# Patient Record
Sex: Female | Born: 1990 | Race: White | Hispanic: No | Marital: Single | Smoking: Current some day smoker
Health system: Southern US, Community
[De-identification: ages and names within clinical notes are randomized; demographics above are authoritative.]

## PROBLEM LIST (undated history)

## (undated) DIAGNOSIS — F4310 Post-traumatic stress disorder, unspecified: Secondary | ICD-10-CM

## (undated) DIAGNOSIS — A749 Chlamydial infection, unspecified: Secondary | ICD-10-CM

## (undated) DIAGNOSIS — I10 Essential (primary) hypertension: Secondary | ICD-10-CM

## (undated) DIAGNOSIS — K219 Gastro-esophageal reflux disease without esophagitis: Secondary | ICD-10-CM

## (undated) DIAGNOSIS — N83209 Unspecified ovarian cyst, unspecified side: Secondary | ICD-10-CM

## (undated) DIAGNOSIS — E785 Hyperlipidemia, unspecified: Secondary | ICD-10-CM

## (undated) HISTORY — DX: Essential (primary) hypertension: I10

## (undated) HISTORY — DX: Post-traumatic stress disorder, unspecified: F43.10

## (undated) HISTORY — PX: OTHER SURGICAL HISTORY: SHX169

## (undated) HISTORY — DX: Hyperlipidemia, unspecified: E78.5

## (undated) HISTORY — DX: Gastro-esophageal reflux disease without esophagitis: K21.9

---
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
## 2008-06-29 ENCOUNTER — Emergency Department (HOSPITAL_COMMUNITY): Admission: EM | Admit: 2008-06-29 | Discharge: 2008-06-30 | Payer: Self-pay | Admitting: Emergency Medicine

## 2008-09-12 ENCOUNTER — Emergency Department (HOSPITAL_COMMUNITY): Admission: EM | Admit: 2008-09-12 | Discharge: 2008-09-13 | Payer: Self-pay | Admitting: Emergency Medicine

## 2010-06-27 ENCOUNTER — Emergency Department (HOSPITAL_COMMUNITY)
Admission: EM | Admit: 2010-06-27 | Discharge: 2010-06-27 | Disposition: A | Discharge status: Home / Self Care | Payer: Self-pay | Attending: Emergency Medicine | Admitting: Emergency Medicine

## 2010-06-27 DIAGNOSIS — H9209 Otalgia, unspecified ear: Secondary | ICD-10-CM | POA: Insufficient documentation

## 2010-08-10 LAB — BASIC METABOLIC PANEL
CO2: 18 mEq/L — ABNORMAL LOW (ref 19–32)
Chloride: 102 mEq/L (ref 96–112)
Creatinine, Ser: 0.78 mg/dL (ref 0.4–1.2)
GFR calc Af Amer: >60 mL/min (ref >60)
Glucose, Bld: 128 mg/dL — ABNORMAL HIGH (ref 70–99)
Potassium: 3.1 mEq/L — ABNORMAL LOW (ref 3.5–5.1)

## 2010-08-10 LAB — DIFFERENTIAL
Basophils Relative: 0 % (ref 0–1)
Lymphocytes Relative: 13 % (ref 12–46)
Monocytes Absolute: 0.8 10*3/uL (ref 0.1–1.0)
Neutrophils Relative %: 83 % — ABNORMAL HIGH (ref 43–77)

## 2010-08-10 LAB — CBC
HCT: 37.8 % (ref 36.0–46.0)
MCHC: 36 g/dL (ref 30.0–36.0)
Platelets: 362 10*3/uL (ref 150–400)
RBC: 4.33 MIL/uL (ref 3.87–5.11)

## 2010-09-17 NOTE — H&P (Signed)
NAME:  Gina Fletcher, Gina Fletcher                        ACCOUNT NO.:  [id number]   MEDICAL RECORD NO.:  [id number]                   PATIENT TYPE:  INP   LOCATION:  A426                                 FACILITY:  [facility]   PHYSICIAN:  Scott A. Gerda Diss, M.D.               DATE OF BIRTH:  07-24-1990   DATE OF ADMISSION:  01/16/2004  DATE OF DISCHARGE:                                HISTORY & PHYSICAL   CHIEF COMPLAINT:  Abdominal pain, fever.   HISTORY OF PRESENT ILLNESS:  This is a 20 year old white female who states  she had onset of abdominal discomfort in the lower abdomen, and in her low  back that started about a week ago, progressed then to having sharp pains in  the lower and upper abdomen, along with increasing discomfort, but nothing  severe, no nausea or vomiting, initially no fever, no cough.  Denies any  dysuria or urinary frequency, just recently got off her cycle.  She was  asked in private if she is sexually active or having intercourse in any such  way, and she states emphatically no, and she never has.  The patient has run  a fever in the evening time for the past couple of nights.  No vomiting.   SOCIAL HISTORY:  Lives with mom.  She does not drink.   FAMILY HISTORY:  Noncontributory.   PAST MEDICAL HISTORY:  No prior admissions except when 33 months of age for  routine illness.   IMMUNIZATIONS:  Up to date.   ALLERGIES:  Not allergic to any medicines.  No on any medications.   REVIEW OF SYSTEMS:  Per above.   PHYSICAL EXAMINATION:  GENERAL:  __________ .  HEENT:  TMs, __________.  MMs moist.  NECK:  Supple.  CHEST:  CTA, no crackles.  HEART:  Regular, no murmurs.  ABDOMEN:  Soft with some subjective lower-abdominal discomfort, but no  guarding or rebound.  FLANK:  Nontender although on the right flank when percussion is done, she  relates some mild discomfort in her abdomen.   Her white count is elevated with a left shift.   Urinalysis has TNTC WBCs with many  bacteria.   CT scan showed a enlargement of the ovaries.  No clear visualization of the  appendix was seen.  No other particular findings.   ASSESSMENT/PLAN:  Febrile illness with probable urinary tract infection.   We will go ahead and do an abdominal ultrasound with pelvic ultrasound to  look at the ovaries and also make sure there is no abscess with kidneys.  I  feel the patient overall should respond to Rocephin.  I truly doubt there is  pelvic inflammatory disease going on.  The patient denies sexual activity,  and I really doubt there is appendicitis going on.  We will repeat a CBC in  the morning.     ___________________________________________  Scott A. Gerda Diss, M.D.   SAL/MEDQ  D:  01/16/2004  T:  01/16/2004  Job:  [id number]

## 2010-09-17 NOTE — Discharge Summary (Signed)
NAMESHARRA, Gina Fletcher              ACCOUNT NO.:  [id number]   MEDICAL RECORD NO.:  [id number]          PATIENT TYPE:  INP   LOCATION:  A426                          FACILITY:  [facility]   PHYSICIAN:  Scott A. Gerda Diss, MD    DATE OF BIRTH:  1990-07-21   DATE OF ADMISSION:  01/16/2004  DATE OF DISCHARGE:  09/19/2005LH                                 DISCHARGE SUMMARY   DISCHARGE DIAGNOSES:  1.  Urinary tract infection.  2.  Hypovolemia.   HOSPITAL COURSE:  The patient was admitted in after experiencing abdominal  pain, fever, lower abdominal discomfort, flank discomfort, and not feeling  good.  Urinalysis had TNTC WBC's and many bacteria, and white count was  elevated with a left shift.  She was placed on IV antibiotics and gradually  improved over the course of the next couple of days.  The patient had  initial CT scan which could not tell the appendix, and because of her  ongoing abdominal pain and discomfort, we went ahead with additional CT scan  on January 19, 2004, which showed a normal appendix.  Therefore, the young  lady was discharged to home.  Her urine culture showed 30,000 of Group B  Strep.  She was discharged to home, given samples from the office, and  instructed to follow up with [place] or the health department within the coming  week.     Scot   SAL/MEDQ  D:  02/19/2004  T:  02/19/2004  Job:  [id number]

## 2010-11-06 ENCOUNTER — Encounter: Payer: Self-pay | Admitting: *Deleted

## 2010-11-06 ENCOUNTER — Emergency Department (HOSPITAL_COMMUNITY)
Admission: EM | Admit: 2010-11-06 | Discharge: 2010-11-06 | Disposition: A | Discharge status: Home / Self Care | Payer: Self-pay | Attending: Emergency Medicine | Admitting: Emergency Medicine

## 2010-11-06 DIAGNOSIS — Z23 Encounter for immunization: Secondary | ICD-10-CM | POA: Insufficient documentation

## 2010-11-06 DIAGNOSIS — F17200 Nicotine dependence, unspecified, uncomplicated: Secondary | ICD-10-CM | POA: Insufficient documentation

## 2010-11-06 DIAGNOSIS — S51812A Laceration without foreign body of left forearm, initial encounter: Secondary | ICD-10-CM

## 2010-11-06 DIAGNOSIS — W260XXA Contact with knife, initial encounter: Secondary | ICD-10-CM | POA: Insufficient documentation

## 2010-11-06 DIAGNOSIS — W261XXA Contact with sword or dagger, initial encounter: Secondary | ICD-10-CM | POA: Insufficient documentation

## 2010-11-06 DIAGNOSIS — S51809A Unspecified open wound of unspecified forearm, initial encounter: Secondary | ICD-10-CM | POA: Insufficient documentation

## 2010-11-06 MED ORDER — LIDOCAINE-EPINEPHRINE 2 %-1:100000 IJ SOLN
1.7000 mL | Freq: Once | INTRAMUSCULAR | Status: AC
  Administered 2010-11-06: 1.7 mL

## 2010-11-06 MED ORDER — LIDOCAINE HCL (PF) 1 % IJ SOLN
INTRAMUSCULAR | Status: AC
  Filled 2010-11-06: qty 5

## 2010-11-06 MED ORDER — TETANUS-DIPHTH-ACELL PERTUSSIS 5-2-15.5 LF-MCG/0.5 IM SUSP
0.5000 mL | Freq: Once | INTRAMUSCULAR | Status: AC
  Administered 2010-11-06: 0.5 mL via INTRAMUSCULAR

## 2010-11-06 MED ORDER — LIDOCAINE HCL 2 % IJ SOLN
INTRAMUSCULAR | Status: AC
  Administered 2010-11-06: 10 mL
  Filled 2010-11-06: qty 1

## 2010-11-06 MED ORDER — TETANUS-DIPHTH-ACELL PERTUSSIS 5-2-15.5 LF-MCG/0.5 IM SUSP
INTRAMUSCULAR | Status: AC
  Administered 2010-11-06: 0.5 mL via INTRAMUSCULAR
  Filled 2010-11-06: qty 0.5
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
## 2010-11-12 ENCOUNTER — Encounter (HOSPITAL_COMMUNITY): Payer: Self-pay | Admitting: *Deleted

## 2010-11-17 ENCOUNTER — Emergency Department (HOSPITAL_COMMUNITY)
Admission: EM | Admit: 2010-11-17 | Discharge: 2010-11-17 | Disposition: A | Discharge status: Home / Self Care | Payer: Self-pay | Attending: Emergency Medicine | Admitting: Emergency Medicine

## 2010-11-17 ENCOUNTER — Encounter (HOSPITAL_COMMUNITY): Payer: Self-pay | Admitting: *Deleted

## 2010-11-17 DIAGNOSIS — Z4802 Encounter for removal of sutures: Secondary | ICD-10-CM | POA: Insufficient documentation

## 2010-11-17 NOTE — ED Provider Notes (Signed)
History     Chief Complaint  Patient presents with  . Suture / Staple Removal   Patient is a 20 y.o. female presenting with suture removal. The history is provided by the patient. The history is limited by a language barrier.  Suture / Staple Removal  The sutures were placed 11 to 14 days ago. There has been no treatment since the wound repair. There has been no drainage from the wound. The redness has improved. There is no swelling present. The pain has improved. She has no difficulty moving the affected extremity or digit.    History reviewed. No pertinent past medical history.  Past Surgical History  Procedure Date  . None     no surgical history    Family History  Problem Relation Age of Onset  . Adopted: Yes  . Hypertension Mother     History  Substance Use Topics  . Smoking status: Current Everyday Smoker -- 0.5 packs/day for 5 years  . Smokeless tobacco: Not on file  . Alcohol Use: Yes     5 days of the week    OB History    Grav Para Term Preterm Abortions TAB SAB Ect Mult Living   2 1   1     1       Review of Systems  Constitutional: Negative for fever and chills.  Musculoskeletal: Negative.   Skin: Positive for wound. Negative for color change.  Neurological: Negative for dizziness, weakness and numbness.  Hematological: Does not bruise/bleed easily.    Physical Exam  BP 143/93  Pulse 107  Temp(Src) 99.2 F (37.3 C) (Oral)  Resp 20  Ht 5\' 2"  (1.575 m)  Wt 155 lb (70.308 kg)  BMI 28.35 kg/m2  SpO2 100%  LMP 10/06/2010  Physical Exam  Nursing note and vitals reviewed. Constitutional: She is oriented to person, place, and time. She appears well-developed and well-nourished. No distress.  HENT:  Head: Normocephalic and atraumatic.  Neck: Normal range of motion.  Cardiovascular: Normal rate and regular rhythm.   Pulmonary/Chest: Effort normal and breath sounds normal.  Musculoskeletal: She exhibits no edema and no tenderness.  Neurological:  She is alert and oriented to person, place, and time. She exhibits abnormal muscle tone. Coordination normal.  Skin: Skin is warm and dry.       Laceration to the left forearm with previous sutured repair.  Wound appears to be healing well.  No erythema, swelling or drainage.  Distal sensation intact.  No obvious weakness .  Radial pulse and cap refill are strong and brisk.  Psychiatric: She has a normal mood and affect.    ED Course  Procedures  MDM  Laceration to left forearm appears to be well healed.  Distal sensation intact.  Radial pulse is strong.  Mild ttp surrounding the wound, w/o erythema, drainage, swelling or excessive warmth.  Patient has full ROM of the forearm and fingers.  I have advised her to f/u with ortho for recheck.  2310  Sutures removed by the nursing staff w/o difficulty, suture line remains intact.     Medical screening examination/treatment/procedure(s) were performed by non-physician practitioner and as supervising physician I was immediately available for consultation/collaboration.    Tammy L. Triplett, PA 11/27/10 1735  Tammy L. [place], [place] 11/27/10 1736  Sunnie Nielsen, MD 11/30/10 [phone]

## 2010-11-17 NOTE — ED Notes (Signed)
Pt here for suture removal

## 2012-12-09 ENCOUNTER — Encounter (HOSPITAL_COMMUNITY): Payer: Self-pay | Admitting: Emergency Medicine

## 2012-12-09 ENCOUNTER — Emergency Department (HOSPITAL_COMMUNITY)
Admission: EM | Admit: 2012-12-09 | Discharge: 2012-12-09 | Disposition: A | Discharge status: Home / Self Care | Payer: Self-pay | Attending: Emergency Medicine | Admitting: Emergency Medicine

## 2012-12-09 DIAGNOSIS — Z8742 Personal history of other diseases of the female genital tract: Secondary | ICD-10-CM | POA: Insufficient documentation

## 2012-12-09 DIAGNOSIS — R05 Cough: Secondary | ICD-10-CM | POA: Insufficient documentation

## 2012-12-09 DIAGNOSIS — F17200 Nicotine dependence, unspecified, uncomplicated: Secondary | ICD-10-CM | POA: Insufficient documentation

## 2012-12-09 DIAGNOSIS — H6690 Otitis media, unspecified, unspecified ear: Secondary | ICD-10-CM | POA: Insufficient documentation

## 2012-12-09 DIAGNOSIS — R059 Cough, unspecified: Secondary | ICD-10-CM | POA: Insufficient documentation

## 2012-12-09 DIAGNOSIS — R55 Syncope and collapse: Secondary | ICD-10-CM | POA: Insufficient documentation

## 2012-12-09 DIAGNOSIS — H9209 Otalgia, unspecified ear: Secondary | ICD-10-CM | POA: Insufficient documentation

## 2012-12-09 DIAGNOSIS — Z3202 Encounter for pregnancy test, result negative: Secondary | ICD-10-CM | POA: Insufficient documentation

## 2012-12-09 DIAGNOSIS — J3489 Other specified disorders of nose and nasal sinuses: Secondary | ICD-10-CM | POA: Insufficient documentation

## 2012-12-09 HISTORY — DX: Unspecified ovarian cyst, unspecified side: N83.209

## 2012-12-09 LAB — CBC WITH DIFFERENTIAL/PLATELET
Basophils Absolute: 0 10*3/uL (ref 0.0–0.1)
Basophils Relative: 0 % (ref 0–1)
Eosinophils Relative: 1 % (ref 0–5)
Hemoglobin: 12.6 g/dL (ref 12.0–15.0)
MCV: 88.9 fL (ref 78.0–100.0)
Monocytes Absolute: 0.8 10*3/uL (ref 0.1–1.0)
Neutrophils Relative %: 79 % — ABNORMAL HIGH (ref 43–77)
RDW: 14.1 % (ref 11.5–15.5)
WBC: 12.5 10*3/uL — ABNORMAL HIGH (ref 4.0–10.5)

## 2012-12-09 LAB — BASIC METABOLIC PANEL
BUN: 9 mg/dL (ref 6–23)
Calcium: 9.6 mg/dL (ref 8.4–10.5)
Chloride: 98 mEq/L (ref 96–112)
GFR calc non Af Amer: >90 mL/min (ref >90)
Glucose, Bld: 108 mg/dL — ABNORMAL HIGH (ref 70–99)
Potassium: 3.7 mEq/L (ref 3.5–5.1)

## 2012-12-09 MED ORDER — OXYMETAZOLINE HCL 0.05 % NA SOLN
1.0000 | Freq: Once | NASAL | Status: AC
  Administered 2012-12-09: 1 via NASAL
  Filled 2012-12-09: qty 15

## 2012-12-09 MED ORDER — PREDNISONE 50 MG PO TABS
60.0000 mg | ORAL_TABLET | Freq: Once | ORAL | Status: AC
  Administered 2012-12-09: 60 mg via ORAL
  Filled 2012-12-09: qty 1

## 2012-12-09 MED ORDER — SODIUM CHLORIDE 0.9 % IV BOLUS (SEPSIS)
1000.0000 mL | Freq: Once | INTRAVENOUS | Status: AC
  Administered 2012-12-09: 1000 mL via INTRAVENOUS

## 2012-12-09 MED ORDER — IBUPROFEN 800 MG PO TABS
800.0000 mg | ORAL_TABLET | Freq: Once | ORAL | Status: AC
  Administered 2012-12-09: 800 mg via ORAL
  Filled 2012-12-09: qty 1

## 2012-12-09 MED ORDER — AMOXICILLIN 500 MG PO CAPS: 500.0000 mg | ORAL_CAPSULE | Freq: Three times a day (TID) | ORAL | Status: DC

## 2012-12-09 MED ORDER — ANTIPYRINE-BENZOCAINE 5.4-1.4 % OT SOLN
3.0000 [drp] | Freq: Once | OTIC | Status: AC
  Administered 2012-12-09: 3 [drp] via OTIC
  Filled 2012-12-09: qty 10

## 2012-12-09 NOTE — ED Notes (Signed)
nad noted prior to dc. Dc instructions reviewed and explained. 1 script given. Pt voiced understanding of dc instructions. Ambulated out without difficulty.

## 2012-12-09 NOTE — ED Notes (Signed)
States that she hit the front of her head when she passed out.

## 2012-12-09 NOTE — ED Notes (Signed)
States that she started having a sore throat and congestion 3 days ago.  States that she started having an earache yesterday.  States that today, she stood up and passed out.  States she has been running a fever at home.

## 2012-12-09 NOTE — ED Provider Notes (Signed)
CSN: [id number]     Arrival date & time 12/09/12  1539 History  This chart was scribed for Gina Quarry, MD by Bennett Scrape, ED Scribe. This patient was seen in room APA09/APA09 and the patient's care was started at 4:30 PM.   Chief Complaint  Patient presents with  . Loss of Consciousness  . Otalgia  . Nasal Congestion  . Cough    The history is provided by the patient. No language interpreter was used.    HPI Comments: Gina Fletcher is a 22 y.o. female who presents to the Emergency Department complaining of left otalgia that started yesterday. She reports one episode of syncope upon waking up and standing this morning. Episode was unwitnessed and pt is unsure of the duration. 3 days of productive cough and congestion, sore throat. She reports a h/o frequent ear infections. She denies possibility of pregnancy. She reports a recent hospitalization due to an abscess which are recurrent for her  Past Medical History  Diagnosis Date  . Ovarian cyst    Past Surgical History  Procedure Laterality Date  . None      no surgical history   Family History  Problem Relation Age of Onset  . Adopted: Yes  . Hypertension Mother    History  Substance Use Topics  . Smoking status: Current Every Day Smoker -- 0.50 packs/day for 5 years  . Smokeless tobacco: Not on file  . Alcohol Use: Yes     Comment: 5 days of the week   OB History   Grav Para Term Preterm Abortions TAB SAB Ect Mult Living   2 1   1     1      Review of Systems  Allergies  Review of patient's allergies indicates no known allergies.  Home Medications  No current outpatient prescriptions on file. BP 116/96  Pulse 104  Temp(Src) 97.7 F (36.5 C) (Oral)  Resp 20  Ht 5\' 2"  (1.575 m)  Wt 159 lb 2 oz (72.179 kg)  BMI 29.1 kg/m2  SpO2 100%  LMP 11/25/2012 Physical Exam  Nursing note and vitals reviewed. Constitutional: She is oriented to person, place, and time. She appears well-developed and  well-nourished. No distress.  HENT:  Head: Normocephalic and atraumatic.  Right Ear: External ear normal.  Left Ear: External ear normal.  Nose: Nose normal.  Mouth/Throat: Oropharynx is clear and moist.  Left TM is dull and injected   Eyes: Conjunctivae and EOM are normal. Pupils are equal, round, and reactive to light.  Neck: Normal range of motion. No tracheal deviation present.  Cardiovascular: Normal rate, regular rhythm, normal heart sounds and intact distal pulses.   Pulmonary/Chest: Effort normal and breath sounds normal. No respiratory distress.  Abdominal: Soft. There is no tenderness.  Musculoskeletal: Normal range of motion. She exhibits no edema.  Neurological: She is alert and oriented to person, place, and time. No cranial nerve deficit.  Skin: Skin is warm and dry.  Psychiatric: She has a normal mood and affect. Her behavior is normal. Judgment and thought content normal.    ED Course   Procedures (including critical care time)  Labs Reviewed  CBC WITH DIFFERENTIAL  PREGNANCY, URINE  BASIC METABOLIC PANEL   No results found. No diagnosis found. Results for orders placed during the hospital encounter of 12/09/12  CBC WITH DIFFERENTIAL      Result Value Range   WBC 12.5 (*) 4.0 - 10.5 K/uL   RBC 4.14  3.87 - 5.11  MIL/uL   Hemoglobin 12.6  12.0 - 15.0 g/dL   HCT 16.1  09.6 - 04.5 %   MCV 88.9  78.0 - 100.0 fL   MCH 30.4  26.0 - 34.0 pg   MCHC 34.2  30.0 - 36.0 g/dL   RDW 40.9  81.1 - 91.4 %   Platelets 329  150 - 400 K/uL   Neutrophils Relative % 79 (*) 43 - 77 %   Neutro Abs 9.9 (*) 1.7 - 7.7 K/uL   Lymphocytes Relative 14  12 - 46 %   Lymphs Abs 1.8  0.7 - 4.0 K/uL   Monocytes Relative 6  3 - 12 %   Monocytes Absolute 0.8  0.1 - 1.0 K/uL   Eosinophils Relative 1  0 - 5 %   Eosinophils Absolute 0.1  0.0 - 0.7 K/uL   Basophils Relative 0  0 - 1 %   Basophils Absolute 0.0  0.0 - 0.1 K/uL  BASIC METABOLIC PANEL      Result Value Range   Sodium 134 (*)  135 - 145 mEq/L   Potassium 3.7  3.5 - 5.1 mEq/L   Chloride 98  96 - 112 mEq/L   CO2 26  19 - 32 mEq/L   Glucose, Bld 108 (*) 70 - 99 mg/dL   BUN 9  6 - 23 mg/dL   Creatinine, Ser 7.82  0.50 - 1.10 mg/dL   Calcium 9.6  8.4 - 95.6 mg/dL   GFR calc non Af Amer >90  >90 mL/min   GFR calc Af Amer >90  >90 mL/min     MDM  21 y.o. Female with otalgia and uri symptoms with left om who presents with ear pain, uri, and syncope this a.m.  EKG normal and patient with probable vasovagal syncope this am with ur for several days and decreased po.  Plan amoxicillin, afrin, and auralgan and advised increase po intake.   I personally performed the services described in this documentation, which was scribed in my presence. The recorded information has been reviewed and considered.   Gina Quarry, MD 12/09/12 [phone]

## 2012-12-09 NOTE — ED Notes (Signed)
Pt c/o nasal congestion, left ear pain, intermittent dizziness, and nausea x3 days. Pt states earlier today she stood up and become dizzy and "blacked out". Pt states she hit her forehead on the ground when she fell. Pt reports ringing in left ear. Pt describes ear pain as sharp. Pt also reports cough that is productive with yellow sputum.

## 2014-03-03 ENCOUNTER — Encounter (HOSPITAL_COMMUNITY): Payer: Self-pay | Admitting: Emergency Medicine

## 2020-03-20 ENCOUNTER — Emergency Department (HOSPITAL_COMMUNITY)
Admission: EM | Admit: 2020-03-20 | Discharge: 2020-03-20 | Disposition: A | Discharge status: Home / Self Care | Payer: Self-pay | Attending: Emergency Medicine | Admitting: Emergency Medicine

## 2020-03-20 ENCOUNTER — Encounter (HOSPITAL_COMMUNITY): Payer: Self-pay

## 2020-03-20 ENCOUNTER — Other Ambulatory Visit: Payer: Self-pay

## 2020-03-20 DIAGNOSIS — K0889 Other specified disorders of teeth and supporting structures: Secondary | ICD-10-CM | POA: Insufficient documentation

## 2020-03-20 DIAGNOSIS — Z87891 Personal history of nicotine dependence: Secondary | ICD-10-CM | POA: Insufficient documentation

## 2020-03-20 MED ORDER — OXYCODONE-ACETAMINOPHEN 5-325 MG PO TABS
1.0000 | ORAL_TABLET | Freq: Once | ORAL | Status: AC
  Administered 2020-03-20: 1 via ORAL
  Filled 2020-03-20: qty 1

## 2020-03-20 MED ORDER — PENICILLIN V POTASSIUM 500 MG PO TABS: 500.0000 mg | ORAL_TABLET | Freq: Four times a day (QID) | ORAL | 0 refills | Status: AC

## 2020-03-20 NOTE — ED Triage Notes (Signed)
Pt reports dental pain from chipped tooth (right bottom back tooth). Pt has appt on Tuesday, but needs something for pain.

## 2020-03-20 NOTE — ED Provider Notes (Signed)
[facility] EMERGENCY DEPARTMENT Provider Note   CSN: [id number] Arrival date & time: 03/20/20  1926     History Chief Complaint  Patient presents with  . Dental Pain    Gina Fletcher is a 29 y.o. female.  HPI   29 year old female history of ovarian cyst presented emergency department today for evaluation of right lower mouth pain.  Has had pain for several days.  Denies any fevers at home has had some facial swelling.  She has an appointment with the dentist in the next few days.  She tried over-the-counter medications without relief.  Pain is constant and severe in nature.  Past Medical History:  Diagnosis Date  . Ovarian cyst     There are no problems to display for this patient.   Past Surgical History:  Procedure Laterality Date  . none     no surgical history     OB History    Gravida  2   Para  1   Term      Preterm      AB  1   Living  1     SAB      TAB      Ectopic      Multiple      Live Births              Family History  Adopted: Yes  Problem Relation Age of Onset  . Hypertension Mother     Social History   Tobacco Use  . Smoking status: Former Smoker    Packs/day: 0.50    Years: 5.00    Pack years: 2.50  . Smokeless tobacco: Never Used  . Tobacco comment: quit one year ago  Vaping Use  . Vaping Use: Former  Substance Use Topics  . Alcohol use: Yes    Comment: 5 days of the week  . Drug use: Yes    Types: Marijuana    Comment: 2-3 blunts a week    Home Medications Prior to Admission medications   Medication Sig Start Date End Date Taking? Authorizing Provider  amoxicillin (AMOXIL) 500 MG capsule Take 1 capsule (500 mg total) by mouth 3 (three) times daily. 12/09/12   Margarita Grizzle, MD  ibuprofen (ADVIL,MOTRIN) 200 MG tablet Take 1,200 mg by mouth once as needed for pain.     [provider]  penicillin v potassium (VEETID) 500 MG tablet Take 1 tablet (500 mg total) by mouth 4 (four) times daily for 7  days. 03/20/20 03/27/20  Stefanny Pieri S, PA-C    Allergies    Patient has no known allergies.  Review of Systems   Review of Systems  Constitutional: Negative for fever.  HENT: Positive for dental problem.     Physical Exam Updated Vital Signs BP (!) 151/112 (BP Location: Right Arm)   Pulse 94   Temp 98.5 F (36.9 C) (Oral)   Resp (!) 22   Ht 5\' 1"  (1.549 m)   Wt 74.8 kg   SpO2 100%   BMI 31.18 kg/m   Physical Exam Vitals and nursing note reviewed.  Constitutional:      General: She is not in acute distress.    Appearance: She is well-developed.  HENT:     Head: Normocephalic and atraumatic.     Mouth/Throat:     Comments: Multiple dental caries and missing teeth. TTP to the right lower molar which reproduces pain. No periapical abscess, sublingual or submandibular swelling Eyes:  Conjunctiva/sclera: Conjunctivae normal.  Cardiovascular:     Rate and Rhythm: Normal rate.  Pulmonary:     Effort: Pulmonary effort is normal.  Musculoskeletal:        General: Normal range of motion.     Cervical back: Neck supple.  Skin:    General: Skin is warm and dry.  Neurological:     Mental Status: She is alert.     ED Results / Procedures / Treatments   Labs (all labs ordered are listed, but only abnormal results are displayed) Labs Reviewed - No data to display  EKG None  Radiology No results found.  Procedures Procedures (including critical care time)  Medications Ordered in ED Medications  oxyCODONE-acetaminophen (PERCOCET/ROXICET) 5-325 MG per tablet 1 tablet (1 tablet Oral Given 03/20/20 2117)    ED Course  I have reviewed the triage vital signs and the nursing notes.  Pertinent labs & imaging results that were available during my care of the patient were reviewed by me and considered in my medical decision making (see chart for details).    MDM Rules/Calculators/A&P                          Patient with toothache.  No gross abscess.  Exam  unconcerning for Ludwig's angina or spread of infection.  Will treat with penicillin and pain medicine.  Urged patient to follow-up with dentist.     Final Clinical Impression(s) / ED Diagnoses Final diagnoses:  Pain, dental    Rx / DC Orders ED Discharge Orders         Ordered    penicillin v potassium (VEETID) 500 MG tablet  4 times daily        03/20/20 2142           Karrie Meres, PA-C 03/20/20 2142    Vanetta Mulders, MD 04/07/20 1455

## 2020-03-20 NOTE — Discharge Instructions (Signed)
You were given a prescription for antibiotics. Please take the antibiotic prescription fully.   Please follow-up with a dentist in the next 5 to 7 days for reevaluation.  If you do not have a dentist, resources were provided for dentist in the area in your discharge summary.  Please contact one of the offices that are listed and make an appointment for follow-up.  Please return to the emergency department for any new or worsening symptoms.  

## 2020-03-20 NOTE — ED Notes (Signed)
Entered room and introduced self to patient. At this time, patient appears tearful and restless in chair. Pt reports 10/10 right lower tooth pain. Respirations are even and unlabored with equal chest rise and fall. All questions and concerns voiced addressed at this time. Educated on call light use and hourly rounding and is in agreement at this time.

## 2020-04-02 ENCOUNTER — Ambulatory Visit: Payer: Self-pay | Admitting: Physician Assistant

## 2020-04-02 ENCOUNTER — Encounter: Payer: Self-pay | Admitting: Physician Assistant

## 2020-04-02 VITALS — BP 110/80 | HR 81 | Temp 96.0°F | Ht 61.5 in | Wt 170.5 lb

## 2020-04-02 DIAGNOSIS — F1290 Cannabis use, unspecified, uncomplicated: Secondary | ICD-10-CM

## 2020-04-02 DIAGNOSIS — K0889 Other specified disorders of teeth and supporting structures: Secondary | ICD-10-CM

## 2020-04-02 DIAGNOSIS — Z1159 Encounter for screening for other viral diseases: Secondary | ICD-10-CM

## 2020-04-02 DIAGNOSIS — Z131 Encounter for screening for diabetes mellitus: Secondary | ICD-10-CM

## 2020-04-02 DIAGNOSIS — Z7689 Persons encountering health services in other specified circumstances: Secondary | ICD-10-CM

## 2020-04-02 MED ORDER — CLINDAMYCIN HCL 300 MG PO CAPS: 300.0000 mg | ORAL_CAPSULE | Freq: Four times a day (QID) | ORAL | 0 refills | Status: DC

## 2020-04-02 NOTE — Progress Notes (Signed)
BP 110/80    Pulse 81    Temp (!) 96 F (35.6 C)    Ht 5' 1.5" (1.562 m)    Wt 170 lb 8 oz (77.3 kg)    SpO2 98%    BMI 31.69 kg/m    Subjective:    Patient ID: Gina Fletcher, female    DOB: 12-Jan-1991, 29 y.o.   MRN: [id number]  HPI: Gina Fletcher is a 29 y.o. female presenting on 04/02/2020 for New Patient (Initial Visit)   HPI    Pt had a negative covid 19 screening questionnaire.  Chief Complaint  Patient presents with   New Patient (Initial Visit)     Pt is 29yoF who presents to establish care.  She says she has not had PCP or gyn for a long time.  She says she was Last seen by gyn in [place] 2 years ago.   She says she had PAP done there then.    Pt says she is having no anxiety now.  She was never on meds for bp or cholesterol.  Pt is trying to get pregnant.    She is not working.  She has not gotten the covid vaccination.  Her only complaint is her teeth.   She has only 1 more dose of her PCN and she says that she can still see pus in the teeth.  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  She says she has never been checked for hepatitis.       Relevant past medical, surgical, family and social history reviewed and updated as indicated. Interim medical history since our last visit reviewed. Allergies and medications reviewed and updated.   Current Outpatient Medications:    acetaminophen (TYLENOL) 500 MG tablet, Take 500 mg by mouth every 6 (six) hours as needed., Disp: , Rfl:    ibuprofen (ADVIL,MOTRIN) 200 MG tablet, Take 1,200 mg by mouth once as needed for pain. , Disp: , Rfl:    penicillin v potassium (VEETID) 500 MG tablet, Take 500 mg by mouth in the morning and at bedtime., Disp: , Rfl:     Review of Systems  Per HPI unless specifically indicated above     Objective:    BP 110/80    Pulse 81    Temp (!) 96 F (35.6 C)    Ht 5' 1.5" (1.562 m)    Wt 170 lb 8 oz (77.3 kg)    SpO2 98%    BMI 31.69 kg/m   Wt  Readings from Last 3 Encounters:  04/02/20 170 lb 8 oz (77.3 kg)  03/20/20 165 lb (74.8 kg)  12/09/12 159 lb 2 oz (72.2 kg)    Physical Exam Vitals reviewed.  Constitutional:      General: She is not in acute distress.    Appearance: She is well-developed. She is not ill-appearing.  HENT:     Head: Normocephalic and atraumatic.     Mouth/Throat:     Mouth: Mucous membranes are moist.     Dentition: Abnormal dentition.     Pharynx: No oropharyngeal exudate.     Comments: Some missing and decayed teeth.  No black or rotting teeth seen.  No swelling of the face.   Eyes:     Conjunctiva/sclera: Conjunctivae normal.     Pupils: Pupils are equal, round, and reactive to light.  Neck:     Thyroid: No thyromegaly.  Cardiovascular:  Rate and Rhythm: Normal rate and regular rhythm.  Pulmonary:     Effort: Pulmonary effort is normal.     Breath sounds: Normal breath sounds.  Abdominal:     General: Bowel sounds are normal.     Palpations: Abdomen is soft. There is no mass.     Tenderness: There is no abdominal tenderness.  Musculoskeletal:     Cervical back: Neck supple.     Right lower leg: No edema.     Left lower leg: No edema.  Lymphadenopathy:     Cervical: No cervical adenopathy.  Skin:    General: Skin is warm and dry.  Neurological:     Mental Status: She is alert and oriented to person, place, and time.     Motor: No weakness or tremor.     Gait: Gait normal.  Psychiatric:        Attention and Perception: Attention normal.        Mood and Affect: Affect is not inappropriate.        Speech: Speech normal.        Behavior: Behavior normal. Behavior is cooperative.          Assessment & Plan:    Encounter Diagnoses  Name Primary?   Encounter to establish care Yes   Screening for diabetes mellitus    Need for hepatitis C screening test    Marijuana use    Dentalgia     -Pt to check her records for name of gyn in [place] that she saw 2 years ago.    -Pt counseled to stop MJ and etoh if she is trying to get pregnant.  -will Check a1c , hepatitis screen.  She will be called with results  -pt is put on the dental list.  She is told that the list is long, up to a year.  rx Clindamycin.  -pt is educated on covid vaccination and is encouraged to get immunized  -pt is scheduled to follow up in 1 year.  She is to contact office sooner prn

## 2020-04-03 ENCOUNTER — Other Ambulatory Visit (HOSPITAL_COMMUNITY)
Admission: RE | Admit: 2020-04-03 | Discharge: 2020-04-03 | Disposition: A | Discharge status: Home / Self Care | Payer: Self-pay | Source: Ambulatory Visit | Attending: Physician Assistant | Admitting: Physician Assistant

## 2020-04-03 DIAGNOSIS — Z131 Encounter for screening for diabetes mellitus: Secondary | ICD-10-CM

## 2020-04-03 DIAGNOSIS — Z1159 Encounter for screening for other viral diseases: Secondary | ICD-10-CM

## 2020-04-03 LAB — COMPREHENSIVE METABOLIC PANEL
ALT: 20 U/L (ref 0–44)
AST: 27 U/L (ref 15–41)
Albumin: 4.2 g/dL (ref 3.5–5.0)
Alkaline Phosphatase: 47 U/L (ref 38–126)
Anion gap: 11 (ref 5–15)
BUN: 14 mg/dL (ref 6–20)
CO2: 23 mmol/L (ref 22–32)
Calcium: 9.5 mg/dL (ref 8.9–10.3)
Chloride: 101 mmol/L (ref 98–111)
Creatinine, Ser: 0.84 mg/dL (ref 0.44–1.00)
GFR, Estimated: >60 mL/min (ref >60)
Glucose, Bld: 105 mg/dL — ABNORMAL HIGH (ref 70–99)
Potassium: 3.9 mmol/L (ref 3.5–5.1)
Sodium: 135 mmol/L (ref 135–145)
Total Bilirubin: 0.5 mg/dL (ref 0.3–1.2)
Total Protein: 7.7 g/dL (ref 6.5–8.1)

## 2020-04-03 LAB — HEMOGLOBIN A1C
Hgb A1c MFr Bld: 5 % (ref 4.8–5.6)
Mean Plasma Glucose: 96.8 mg/dL

## 2020-04-06 ENCOUNTER — Other Ambulatory Visit: Payer: Self-pay | Admitting: Physician Assistant

## 2020-04-06 DIAGNOSIS — Z1159 Encounter for screening for other viral diseases: Secondary | ICD-10-CM

## 2020-04-06 NOTE — Progress Notes (Unsigned)
hepa

## 2020-05-02 DIAGNOSIS — A5901 Trichomonal vulvovaginitis: Secondary | ICD-10-CM | POA: Diagnosis present

## 2020-05-02 DIAGNOSIS — A549 Gonococcal infection, unspecified: Secondary | ICD-10-CM

## 2020-05-02 DIAGNOSIS — A599 Trichomoniasis, unspecified: Secondary | ICD-10-CM

## 2020-05-02 HISTORY — DX: Trichomoniasis, unspecified: A59.9

## 2020-05-02 HISTORY — DX: Gonococcal infection, unspecified: A54.9

## 2020-05-02 HISTORY — DX: Trichomonal vulvovaginitis: A59.01

## 2021-03-08 ENCOUNTER — Encounter: Payer: Self-pay | Admitting: Physician Assistant

## 2021-03-08 ENCOUNTER — Ambulatory Visit: Payer: Self-pay | Admitting: Physician Assistant

## 2021-03-08 VITALS — BP 139/83 | HR 81 | Temp 98.7°F | Wt 168.0 lb

## 2021-03-08 DIAGNOSIS — Z0000 Encounter for general adult medical examination without abnormal findings: Secondary | ICD-10-CM

## 2021-03-08 DIAGNOSIS — E669 Obesity, unspecified: Secondary | ICD-10-CM

## 2021-03-08 DIAGNOSIS — R202 Paresthesia of skin: Secondary | ICD-10-CM

## 2021-03-08 MED ORDER — PREDNISONE 10 MG PO TABS: ORAL_TABLET | ORAL | 0 refills | Status: DC

## 2021-03-08 NOTE — Progress Notes (Signed)
BP 139/83   Pulse 81   Temp 98.7 F (37.1 C)   Wt 168 lb (76.2 kg)   SpO2 98%   BMI 31.23 kg/m    Subjective:    Patient ID: Gina Fletcher, female    DOB: [birth year], 30 y.o.   MRN: [id number]  HPI: Gina Fletcher is a 30 y.o. 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 She says she couldn't remember name of her gyn in [place] (she was previously going to check so her PAP records could be obtained). She is still off drugs.  She complains of Numbness and tingling in her hands and up her arms for about a week.  It is just her right arm.  She denies injury.  she started working at [employer] a week ago.  She is wearing braces because she had carpal tunnel in the past and thought it might help.    At work, She does Civil engineer, contracting labeling-  she is not doing one thing all day long but is very busy.  LMP  2 wk.  She is still trying to get pregnant.   She was taking conception pills she got on [place] but isn't any more (which are just prenatal vitamins)   She is working 5 days /week and numbness persisted in the arm even over the weekend.        Relevant past medical, surgical, family and social history reviewed and updated as indicated. Interim medical history since our last visit reviewed. Allergies and medications reviewed and updated.    No current outpatient medications on file.     Review of Systems  Per HPI unless specifically indicated above     Objective:    BP 139/83   Pulse 81   Temp 98.7 F (37.1 C)   Wt 168 lb (76.2 kg)   SpO2 98%   BMI 31.23 kg/m   Wt Readings from Last 3 Encounters:  03/08/21 168 lb (76.2 kg)  04/02/20 170 lb 8 oz (77.3 kg)  03/20/20 165 lb (74.8 kg)    Physical Exam Vitals reviewed.  Constitutional:      General: She is not in acute distress.    Appearance: She is well-developed. She is not ill-appearing.   HENT:     Head: Normocephalic and atraumatic.     Right Ear: Tympanic membrane, ear canal and external ear normal.     Left Ear: Tympanic membrane, ear canal and external ear normal.  Eyes:     Extraocular Movements: Extraocular movements intact.     Conjunctiva/sclera: Conjunctivae normal.     Pupils: Pupils are equal, round, and reactive to light.  Neck:     Thyroid: No thyromegaly.  Cardiovascular:     Rate and Rhythm: Normal rate and regular rhythm.  Pulmonary:     Effort: Pulmonary effort is normal.     Breath sounds: Normal breath sounds.  Abdominal:     General: Bowel sounds are normal.     Palpations: Abdomen is soft. There is no mass.     Tenderness: There is no abdominal tenderness.  Musculoskeletal:     Right elbow: No swelling. Normal range of motion.     Right forearm: No swelling.     Right wrist: No swelling, deformity or tenderness. Normal range of motion.     Right hand:  No swelling or tenderness. Normal range of motion.     Cervical back: Neck supple. No swelling or tenderness. Normal range of motion.     Right lower leg: No edema.     Left lower leg: No edema.     Comments: Strong grips strength bilaterally.  Negative Tinel and Phalen's. Pt has sensation to touch RUE.   Lymphadenopathy:     Cervical: No cervical adenopathy.  Skin:    General: Skin is warm and dry.  Neurological:     Mental Status: She is alert and oriented to person, place, and time.     Motor: No weakness or tremor.     Gait: Gait is intact. Gait normal.  Psychiatric:        Attention and Perception: Attention normal.        Speech: Speech normal.        Behavior: Behavior normal. Behavior is cooperative.         Assessment & Plan:    Encounter Diagnoses  Name Primary?   Encounter for annual health examination Yes   Tingling of right upper extremity    Obesity, unspecified classification, unspecified obesity type, unspecified whether serious comorbidity present        -Prednisone taper for the UE symptoms -pt is on Dental list -discussed borderline BP.  Will monitor.  Gave handout on DASH diet.   -will Schedule PAP to be done later this month. She is to contact office sooner for new or worsening symptoms

## 2021-03-08 NOTE — Patient Instructions (Signed)

## 2021-03-30 ENCOUNTER — Ambulatory Visit: Payer: Self-pay | Admitting: Physician Assistant

## 2021-03-30 ENCOUNTER — Encounter (HOSPITAL_COMMUNITY): Payer: Self-pay | Admitting: Emergency Medicine

## 2021-03-30 ENCOUNTER — Other Ambulatory Visit: Payer: Self-pay

## 2021-03-30 ENCOUNTER — Inpatient Hospital Stay (HOSPITAL_COMMUNITY)
Admission: AD | Admit: 2021-03-30 | Discharge: 2021-03-30 | Disposition: A | Discharge status: Home / Self Care | Payer: Self-pay | Attending: Obstetrics and Gynecology | Admitting: Obstetrics and Gynecology

## 2021-03-30 ENCOUNTER — Emergency Department (HOSPITAL_COMMUNITY): Payer: Self-pay

## 2021-03-30 DIAGNOSIS — Z5329 Procedure and treatment not carried out because of patient's decision for other reasons: Secondary | ICD-10-CM | POA: Insufficient documentation

## 2021-03-30 DIAGNOSIS — R102 Pelvic and perineal pain: Secondary | ICD-10-CM | POA: Insufficient documentation

## 2021-03-30 DIAGNOSIS — O99891 Other specified diseases and conditions complicating pregnancy: Secondary | ICD-10-CM

## 2021-03-30 DIAGNOSIS — R1013 Epigastric pain: Secondary | ICD-10-CM | POA: Insufficient documentation

## 2021-03-30 DIAGNOSIS — Z3202 Encounter for pregnancy test, result negative: Secondary | ICD-10-CM | POA: Insufficient documentation

## 2021-03-30 DIAGNOSIS — Z8619 Personal history of other infectious and parasitic diseases: Secondary | ICD-10-CM | POA: Insufficient documentation

## 2021-03-30 DIAGNOSIS — A5901 Trichomonal vulvovaginitis: Secondary | ICD-10-CM | POA: Insufficient documentation

## 2021-03-30 DIAGNOSIS — R1031 Right lower quadrant pain: Secondary | ICD-10-CM | POA: Insufficient documentation

## 2021-03-30 DIAGNOSIS — K047 Periapical abscess without sinus: Secondary | ICD-10-CM | POA: Insufficient documentation

## 2021-03-30 DIAGNOSIS — I10 Essential (primary) hypertension: Secondary | ICD-10-CM | POA: Insufficient documentation

## 2021-03-30 HISTORY — DX: Chlamydial infection, unspecified: A74.9

## 2021-03-30 LAB — COMPREHENSIVE METABOLIC PANEL
ALT: 39 U/L (ref 0–44)
AST: 44 U/L — ABNORMAL HIGH (ref 15–41)
Albumin: 3.5 g/dL (ref 3.5–5.0)
Alkaline Phosphatase: 136 U/L — ABNORMAL HIGH (ref 38–126)
Anion gap: 10 (ref 5–15)
BUN: 7 mg/dL (ref 6–20)
CO2: 22 mmol/L (ref 22–32)
Calcium: 9.1 mg/dL (ref 8.9–10.3)
Chloride: 101 mmol/L (ref 98–111)
Creatinine, Ser: 0.84 mg/dL (ref 0.44–1.00)
GFR, Estimated: >60 mL/min (ref >60)
Glucose, Bld: 132 mg/dL — ABNORMAL HIGH (ref 70–99)
Potassium: 4.1 mmol/L (ref 3.5–5.1)
Sodium: 133 mmol/L — ABNORMAL LOW (ref 135–145)
Total Bilirubin: 0.5 mg/dL (ref 0.3–1.2)
Total Protein: 7.3 g/dL (ref 6.5–8.1)

## 2021-03-30 LAB — URINALYSIS, ROUTINE W REFLEX MICROSCOPIC
Bilirubin Urine: NEGATIVE
Glucose, UA: NEGATIVE mg/dL
Ketones, ur: NEGATIVE mg/dL
Nitrite: NEGATIVE
Protein, ur: NEGATIVE mg/dL
Specific Gravity, Urine: <1.005 — ABNORMAL LOW (ref 1.005–1.030)
pH: 6 (ref 5.0–8.0)

## 2021-03-30 LAB — WET PREP, GENITAL
Clue Cells Wet Prep HPF POC: NONE SEEN
Sperm: NONE SEEN
WBC, Wet Prep HPF POC: >=10 — AB (ref <10)
Yeast Wet Prep HPF POC: NONE SEEN

## 2021-03-30 LAB — URINALYSIS, MICROSCOPIC (REFLEX)

## 2021-03-30 LAB — CBC WITH DIFFERENTIAL/PLATELET
Abs Immature Granulocytes: 0.15 10*3/uL — ABNORMAL HIGH (ref 0.00–0.07)
Basophils Absolute: 0.1 10*3/uL (ref 0.0–0.1)
Basophils Relative: 0 %
Eosinophils Absolute: 0 10*3/uL (ref 0.0–0.5)
Eosinophils Relative: 0 %
HCT: 35.7 % — ABNORMAL LOW (ref 36.0–46.0)
Hemoglobin: 11.5 g/dL — ABNORMAL LOW (ref 12.0–15.0)
Immature Granulocytes: 1 %
Lymphocytes Relative: 6 %
Lymphs Abs: 1.2 10*3/uL (ref 0.7–4.0)
MCH: 31.5 pg (ref 26.0–34.0)
MCHC: 32.2 g/dL (ref 30.0–36.0)
MCV: 97.8 fL (ref 80.0–100.0)
Monocytes Absolute: 0.9 10*3/uL (ref 0.1–1.0)
Monocytes Relative: 4 %
Neutro Abs: 17.7 10*3/uL — ABNORMAL HIGH (ref 1.7–7.7)
Neutrophils Relative %: 89 %
Platelets: 433 10*3/uL — ABNORMAL HIGH (ref 150–400)
RBC: 3.65 MIL/uL — ABNORMAL LOW (ref 3.87–5.11)
RDW: 12 % (ref 11.5–15.5)
WBC: 19.9 10*3/uL — ABNORMAL HIGH (ref 4.0–10.5)
nRBC: 0 % (ref 0.0–0.2)

## 2021-03-30 LAB — I-STAT BETA HCG BLOOD, ED (MC, WL, AP ONLY): I-stat hCG, quantitative: 23 m[IU]/mL — ABNORMAL HIGH (ref <5)

## 2021-03-30 LAB — LIPASE, BLOOD: Lipase: 24 U/L (ref 11–51)

## 2021-03-30 LAB — HCG, QUANTITATIVE, PREGNANCY: hCG, Beta Chain, Quant, S: <1 m[IU]/mL (ref <5)

## 2021-03-30 MED ORDER — METRONIDAZOLE 500 MG PO TABS
2000.0000 mg | ORAL_TABLET | Freq: Once | ORAL | Status: AC
  Administered 2021-03-30: 2000 mg via ORAL

## 2021-03-30 MED ORDER — HYDROMORPHONE HCL 1 MG/ML IJ SOLN
1.0000 mg | INTRAMUSCULAR | Status: DC | PRN
  Administered 2021-03-30: 1 mg via INTRAVENOUS

## 2021-03-30 MED ORDER — LACTATED RINGERS IV SOLN: INTRAVENOUS | Status: DC

## 2021-03-30 MED ORDER — AZITHROMYCIN 250 MG PO TABS
1000.0000 mg | ORAL_TABLET | Freq: Once | ORAL | Status: AC
  Administered 2021-03-30: 1000 mg via ORAL

## 2021-03-30 MED ORDER — CEFTRIAXONE SODIUM 500 MG IJ SOLR
500.0000 mg | Freq: Once | INTRAMUSCULAR | Status: AC
  Administered 2021-03-30: 500 mg via INTRAMUSCULAR

## 2021-03-30 MED ORDER — ONDANSETRON HCL 4 MG/2ML IJ SOLN
4.0000 mg | Freq: Once | INTRAMUSCULAR | Status: AC
  Administered 2021-03-30: 4 mg via INTRAVENOUS

## 2021-03-30 MED ORDER — LIDOCAINE HCL (PF) 1 % IJ SOLN
2.0000 mL | Freq: Once | INTRAMUSCULAR | Status: AC
  Administered 2021-03-30: 1 mL

## 2021-03-30 NOTE — ED Provider Notes (Signed)
Emergency Medicine Provider Triage Evaluation Note  Gina Fletcher , a 30 y.o. female  was evaluated in triage.  Pt complains of epigastric abdominal pain with nausea vomiting and diarrhea for the past week.  Patient reports she was fine yesterday, but today woke with lower abdominal cramping and vaginal bleeding/clots.  Patient where she was on her menstrual period last week and it only last 3 days, it stopped for a day and started again.  Denies any lightheadedness or dizziness.  Denies any fevers, chest pain, or shortness of breath.  Reports pain is 10 out of 10.  Review of Systems  Positive: Abdominal pain, nausea, vomiting, diarrhea, vaginal bleeding Negative: Fevers, shortness of breath, chest pain  Physical Exam  BP (!) 144/95 (BP Location: Left Arm)   Pulse (!) 110   Temp 99.7 F (37.6 C) (Oral)   Resp 18   LMP 03/23/2021   SpO2 100%  Gen:   Awake, no distress   Resp:  Normal effort  MSK:   Moves extremities without difficulty  Other:  Generalized abdominal tenderness  Medical Decision Making  Medically screening exam initiated at 9:29 AM.  Appropriate orders placed.  Gina Fletcher was informed that the remainder of the evaluation will be completed by another provider, this initial triage assessment does not replace that evaluation, and the importance of remaining in the ED until their evaluation is complete.  Labs, urinalysis, and ultrasound ordered.   Achille Rich, PA-C 03/30/21 [id number]    Gerhard Munch, MD 04/03/21 [phone]

## 2021-03-30 NOTE — ED Triage Notes (Signed)
C/o upper abd pain with nausea, vomiting, and diarrhea x 1 week that resolved x 1 day.  Lower abd pain started yesterday.  Pt states she started her period 1 week ago that lasted longer than normal, stopped, and now having some vaginal bleeding with clots.

## 2021-03-30 NOTE — MAU Provider Note (Signed)
History     CSN: [id number]  Arrival date and time: 03/30/21 0856   Event Date/Time   First Provider Initiated Contact with Patient 03/30/21 1144      Chief Complaint  Patient presents with   Abdominal Pain   30 y.o. G3P201 @unknown  gestation sent from ED for LAP and positive I-Stat. Reports onset of pain 1 week ago that was just above the umbilicus. Pain became worse this am and now in RLQ. Describes as intermittent lasting 15 seconds and sharp. Rates pain 10/10. Has not treated. Endorses onset of VB this am, small amt. Reports LMP was last week but was shorter than usual. Pt also reports being treated for tooth abscess. Currently on abx, unsure which one.   OB History     Gravida  3   Para  1   Term      Preterm      AB  1   Living  1      SAB      IAB      Ectopic      Multiple      Live Births              Past Medical History:  Diagnosis Date   GERD (gastroesophageal reflux disease)    Hyperlipidemia    Hypertension    Ovarian cyst    PTSD (post-traumatic stress disorder)     Past Surgical History:  Procedure Laterality Date   none     no surgical history    Family History  Adopted: Yes  Problem Relation Age of Onset   Hypertension Mother    Heart attack Mother     Social History   Tobacco Use   Smoking status: Some Days    Packs/day: 0.50    Years: 15.00    Pack years: 7.50    Types: Cigarettes   Smokeless tobacco: Never  Vaping Use   Vaping Use: Every day  Substance Use Topics   Alcohol use: Yes    Alcohol/week: 10.0 standard drinks    Types: 10 Cans of beer per week    Comment: drinks about 4-5 beers twice a week.    Drug use: Yes    Types: Marijuana, Methamphetamines    Comment: 2-3 blunts a week.  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 Day 2 take 5 tablets po  qam. Day 3 take 4 tablets po qam. Day 4 take 3 tablets po qam. Day 5 take 2 tablets po qam. Day 6 take 1 tablet po qam. 21 tablet 0    Review of Systems  Constitutional:  Negative for chills and fever.  Gastrointestinal:  Positive for abdominal pain and nausea. Negative for diarrhea and vomiting.  Genitourinary:  Positive for vaginal bleeding. Negative for dysuria, frequency and urgency.  Physical Exam   Blood pressure 116/66, pulse 96, temperature 99.7 F (37.6 C), temperature source Oral, resp. rate (!) 22, last menstrual period 03/23/2021, SpO2 99 %.  Physical Exam Vitals and nursing note reviewed.  Constitutional:      General: She is in acute distress (rocking in bed).     Appearance: Normal appearance.  HENT:     Head: Normocephalic and atraumatic.  Cardiovascular:     Rate and Rhythm: Tachycardia present.  Pulmonary:     Effort: Pulmonary  effort is normal. No respiratory distress.  Abdominal:     General: There is no distension.     Palpations: Abdomen is soft. There is no mass.     Tenderness: There is abdominal tenderness. There is rebound (RLQ). There is no guarding.     Hernia: No hernia is present.  Genitourinary:    Comments: deferred Musculoskeletal:        General: Normal range of motion.     Cervical back: Normal range of motion.  Skin:    General: Skin is warm and dry.  Neurological:     General: No focal deficit present.     Mental Status: She is alert and oriented to person, place, and time.  Psychiatric:        Mood and Affect: Mood normal.        Behavior: Behavior normal.   Results for orders placed or performed during the hospital encounter of 03/30/21 (from the past 24 hour(s))  Urinalysis, Routine w reflex microscopic     Status: Abnormal   Collection Time: 03/30/21  9:16 AM  Result Value Ref Range   Color, Urine YELLOW YELLOW   APPearance CLEAR CLEAR   Specific Gravity, Urine <1.005 (L) 1.005 - 1.030   pH 6.0 5.0 - 8.0   Glucose, UA NEGATIVE  NEGATIVE mg/dL   Hgb urine dipstick LARGE (A) NEGATIVE   Bilirubin Urine NEGATIVE NEGATIVE   Ketones, ur NEGATIVE NEGATIVE mg/dL   Protein, ur NEGATIVE NEGATIVE mg/dL   Nitrite NEGATIVE NEGATIVE   Leukocytes,Ua TRACE (A) NEGATIVE  Urinalysis, Microscopic (reflex)     Status: Abnormal   Collection Time: 03/30/21  9:16 AM  Result Value Ref Range   RBC / HPF 0-5 0 - 5 RBC/hpf   WBC, UA 0-5 0 - 5 WBC/hpf   Bacteria, UA RARE (A) NONE SEEN   Squamous Epithelial / LPF 0-5 0 - 5   Mucus PRESENT   CBC with Differential     Status: Abnormal   Collection Time: 03/30/21  9:21 AM  Result Value Ref Range   WBC 19.9 (H) 4.0 - 10.5 K/uL   RBC 3.65 (L) 3.87 - 5.11 MIL/uL   Hemoglobin 11.5 (L) 12.0 - 15.0 g/dL   HCT 61.4 (L) 43.1 - 54.0 %   MCV 97.8 80.0 - 100.0 fL   MCH 31.5 26.0 - 34.0 pg   MCHC 32.2 30.0 - 36.0 g/dL   RDW 08.6 76.1 - 95.0 %   Platelets 433 (H) 150 - 400 K/uL   nRBC 0.0 0.0 - 0.2 %   Neutrophils Relative % 89 %   Neutro Abs 17.7 (H) 1.7 - 7.7 K/uL   Lymphocytes Relative 6 %   Lymphs Abs 1.2 0.7 - 4.0 K/uL   Monocytes Relative 4 %   Monocytes Absolute 0.9 0.1 - 1.0 K/uL   Eosinophils Relative 0 %   Eosinophils Absolute 0.0 0.0 - 0.5 K/uL   Basophils Relative 0 %   Basophils Absolute 0.1 0.0 - 0.1 K/uL   Immature Granulocytes 1 %   Abs Immature Granulocytes 0.15 (H) 0.00 - 0.07 K/uL  Comprehensive metabolic panel     Status: Abnormal   Collection Time: 03/30/21  9:21 AM  Result Value Ref Range   Sodium 133 (L) 135 - 145 mmol/L   Potassium 4.1 3.5 - 5.1 mmol/L   Chloride 101 98 - 111 mmol/L   CO2 22 22 - 32 mmol/L   Glucose, Bld 132 (H) 70 - 99 mg/dL  BUN 7 6 - 20 mg/dL   Creatinine, Ser 9.60 0.44 - 1.00 mg/dL   Calcium 9.1 8.9 - 45.4 mg/dL   Total Protein 7.3 6.5 - 8.1 g/dL   Albumin 3.5 3.5 - 5.0 g/dL   AST 44 (H) 15 - 41 U/L   ALT 39 0 - 44 U/L   Alkaline Phosphatase 136 (H) 38 - 126 U/L   Total Bilirubin 0.5 0.3 - 1.2 mg/dL   GFR, Estimated >09 >81 mL/min    Anion gap 10 5 - 15  Lipase, blood     Status: None   Collection Time: 03/30/21  9:21 AM  Result Value Ref Range   Lipase 24 11 - 51 U/L  hCG, quantitative, pregnancy     Status: None   Collection Time: 03/30/21  9:21 AM  Result Value Ref Range   hCG, Beta Chain, Quant, S <1 <5 mIU/mL  I-Stat Beta hCG blood, ED ([facility], [facility], [facility] only)     Status: Abnormal   Collection Time: 03/30/21 10:32 AM  Result Value Ref Range   I-stat hCG, quantitative 23.0 (H) <5 mIU/mL   Comment 3          Wet prep, genital     Status: Abnormal   Collection Time: 03/30/21  1:21 PM   Specimen: Vaginal  Result Value Ref Range   Yeast Wet Prep HPF POC NONE SEEN NONE SEEN   Trich, Wet Prep PRESENT (A) NONE SEEN   Clue Cells Wet Prep HPF POC NONE SEEN NONE SEEN   WBC, Wet Prep HPF POC >=10 (A) <10   Sperm NONE SEEN    US OB LESS THAN 14 WEEKS W/ OB TRANSVAGINAL AND DOPPLER  Result Date: 03/30/2021 CLINICAL DATA:  Pelvic pain EXAM: OBSTETRIC <14 WK [place] AND TRANSVAGINAL OB US DOPPLER ULTRASOUND OF OVARIES TECHNIQUE: Both transabdominal and transvaginal ultrasound examinations were performed for complete evaluation of the gestation as well as the maternal uterus, adnexal regions, and pelvic cul-de-sac. Transvaginal technique was performed to assess early pregnancy. Color and duplex Doppler ultrasound was utilized to evaluate blood flow to the ovaries. COMPARISON:  None. FINDINGS: Intrauterine gestational sac: None Yolk sac:  Not Visualized. Embryo:  Not Visualized. Cardiac Activity: Not Visualized. Subchorionic hemorrhage:  None visualized. Maternal uterus/adnexae: Normal bilateral ovaries. Moderate amount of pelvic free fluid. Pulsed Doppler evaluation of both ovaries demonstrates normal appearing low-resistance arterial and venous waveforms. IMPRESSION: 1. No intrauterine pregnancy is identified. Correlate with serial beta HCG. 2. Normal bilateral ovaries.  No ovarian torsion. Electronically Signed   By: Elige Ko M.D.    On: 03/30/2021 13:01    MAU Course  Procedures LR Dilaudid Azithro Flagyl Rocephin  MDM Plan work-up for acute abdomen. QhCG ordered and reported <1, repeated and confirmed <1. +trich on wet prep. Informed pt of neg hCG (not pregnant) and +STI. Pt reports hx of Chlamydia 8 years ago and had similar pain. Partner at bedside and pt suggested he had been unfaithful. Elevated WBCs and acute abd exam, will order CT to r/o appendicitis, if negative will treat for PID.  1500: pt refusing CT, states she feels certain the pain is from the STI and declines further evaluation. I explained she has signs of an acute abdominal process and could have a life threatening condition such as appendicitis. She continues to decline stating she is uninsured and feels this is STI only, informed she will need to sign AMA. Will treat for all STDs per her request. Informed partner will  need treatment and abstain for 2 weeks after.   Assessment and Plan   1. Trichomonal vaginitis   2. Pelvic pain   3. Abdominal pain, RLQ   4. Hypertension, unspecified type    Left AMA   Donette Larry, CNM 03/30/2021, 3:56 PM

## 2021-03-30 NOTE — ED Notes (Signed)
Report called to [facility] in MAU.

## 2021-03-30 NOTE — MAU Note (Signed)
Pt transfer from ED with lower abd pain. Pain started 1 week ago off/on but got much worse today. Vaginal bleeding that started today.

## 2021-03-31 ENCOUNTER — Inpatient Hospital Stay (HOSPITAL_COMMUNITY)
Admission: EM | Admit: 2021-03-31 | Discharge: 2021-04-04 | DRG: 758 | Disposition: A | Discharge status: Home / Self Care | Payer: Self-pay | Attending: Obstetrics & Gynecology | Admitting: Obstetrics & Gynecology

## 2021-03-31 ENCOUNTER — Encounter (HOSPITAL_COMMUNITY): Payer: Self-pay

## 2021-03-31 DIAGNOSIS — R1031 Right lower quadrant pain: Secondary | ICD-10-CM

## 2021-03-31 DIAGNOSIS — Z20822 Contact with and (suspected) exposure to covid-19: Secondary | ICD-10-CM | POA: Diagnosis present

## 2021-03-31 DIAGNOSIS — A5402 Gonococcal vulvovaginitis, unspecified: Secondary | ICD-10-CM | POA: Diagnosis present

## 2021-03-31 DIAGNOSIS — L0291 Cutaneous abscess, unspecified: Secondary | ICD-10-CM

## 2021-03-31 DIAGNOSIS — F17210 Nicotine dependence, cigarettes, uncomplicated: Secondary | ICD-10-CM | POA: Diagnosis present

## 2021-03-31 DIAGNOSIS — A549 Gonococcal infection, unspecified: Secondary | ICD-10-CM | POA: Diagnosis present

## 2021-03-31 DIAGNOSIS — A5901 Trichomonal vulvovaginitis: Secondary | ICD-10-CM | POA: Diagnosis present

## 2021-03-31 DIAGNOSIS — R1030 Lower abdominal pain, unspecified: Secondary | ICD-10-CM | POA: Diagnosis present

## 2021-03-31 DIAGNOSIS — N7093 Salpingitis and oophoritis, unspecified: Principal | ICD-10-CM | POA: Diagnosis present

## 2021-03-31 LAB — GC/CHLAMYDIA PROBE AMP (~~LOC~~) NOT AT ARMC
Chlamydia: NEGATIVE
Comment: NEGATIVE
Comment: NORMAL
Neisseria Gonorrhea: POSITIVE — AB

## 2021-03-31 LAB — CBC WITH DIFFERENTIAL/PLATELET
Abs Immature Granulocytes: 0.16 10*3/uL — ABNORMAL HIGH (ref 0.00–0.07)
Basophils Absolute: 0.1 10*3/uL (ref 0.0–0.1)
Basophils Relative: 0 %
Eosinophils Absolute: 0.1 10*3/uL (ref 0.0–0.5)
Eosinophils Relative: 0 %
HCT: 35.1 % — ABNORMAL LOW (ref 36.0–46.0)
Hemoglobin: 11.3 g/dL — ABNORMAL LOW (ref 12.0–15.0)
Immature Granulocytes: 1 %
Lymphocytes Relative: 9 %
Lymphs Abs: 1.7 10*3/uL (ref 0.7–4.0)
MCH: 31.7 pg (ref 26.0–34.0)
MCHC: 32.2 g/dL (ref 30.0–36.0)
MCV: 98.3 fL (ref 80.0–100.0)
Monocytes Absolute: 0.9 10*3/uL (ref 0.1–1.0)
Monocytes Relative: 5 %
Neutro Abs: 15.4 10*3/uL — ABNORMAL HIGH (ref 1.7–7.7)
Neutrophils Relative %: 85 %
Platelets: 406 10*3/uL — ABNORMAL HIGH (ref 150–400)
RBC: 3.57 MIL/uL — ABNORMAL LOW (ref 3.87–5.11)
RDW: 12.1 % (ref 11.5–15.5)
WBC: 18.3 10*3/uL — ABNORMAL HIGH (ref 4.0–10.5)
nRBC: 0.1 % (ref 0.0–0.2)

## 2021-03-31 LAB — COMPREHENSIVE METABOLIC PANEL
ALT: 30 U/L (ref 0–44)
AST: 24 U/L (ref 15–41)
Albumin: 3.6 g/dL (ref 3.5–5.0)
Alkaline Phosphatase: 122 U/L (ref 38–126)
Anion gap: 10 (ref 5–15)
BUN: 7 mg/dL (ref 6–20)
CO2: 25 mmol/L (ref 22–32)
Calcium: 8.9 mg/dL (ref 8.9–10.3)
Chloride: 97 mmol/L — ABNORMAL LOW (ref 98–111)
Creatinine, Ser: 0.75 mg/dL (ref 0.44–1.00)
GFR, Estimated: >60 mL/min (ref >60)
Glucose, Bld: 146 mg/dL — ABNORMAL HIGH (ref 70–99)
Potassium: 3.3 mmol/L — ABNORMAL LOW (ref 3.5–5.1)
Sodium: 132 mmol/L — ABNORMAL LOW (ref 135–145)
Total Bilirubin: 0.5 mg/dL (ref 0.3–1.2)
Total Protein: 7.8 g/dL (ref 6.5–8.1)

## 2021-03-31 LAB — RESP PANEL BY RT-PCR (FLU A&B, COVID) ARPGX2
Influenza A by PCR: NEGATIVE
Influenza B by PCR: NEGATIVE
SARS Coronavirus 2 by RT PCR: NEGATIVE

## 2021-03-31 LAB — URINALYSIS, ROUTINE W REFLEX MICROSCOPIC
Bilirubin Urine: NEGATIVE
Glucose, UA: NEGATIVE mg/dL
Ketones, ur: NEGATIVE mg/dL
Nitrite: NEGATIVE
Protein, ur: NEGATIVE mg/dL
Specific Gravity, Urine: <1.005 — ABNORMAL LOW (ref 1.005–1.030)
pH: 5.5 (ref 5.0–8.0)

## 2021-03-31 LAB — URINALYSIS, MICROSCOPIC (REFLEX): Bacteria, UA: NONE SEEN

## 2021-03-31 LAB — HCG, QUANTITATIVE, PREGNANCY: hCG, Beta Chain, Quant, S: 1 m[IU]/mL (ref <5)

## 2021-03-31 LAB — LIPASE, BLOOD: Lipase: 23 U/L (ref 11–51)

## 2021-03-31 MED ORDER — KETOROLAC TROMETHAMINE 30 MG/ML IJ SOLN
30.0000 mg | Freq: Once | INTRAMUSCULAR | Status: AC
  Administered 2021-03-31: 30 mg via INTRAVENOUS
  Filled 2021-03-31: qty 1

## 2021-03-31 MED ORDER — MORPHINE SULFATE (PF) 4 MG/ML IV SOLN
4.0000 mg | INTRAVENOUS | Status: DC | PRN
  Administered 2021-03-31: 4 mg via INTRAVENOUS
  Filled 2021-03-31: qty 1

## 2021-03-31 MED ORDER — KETOROLAC TROMETHAMINE 30 MG/ML IJ SOLN
30.0000 mg | Freq: Four times a day (QID) | INTRAMUSCULAR | Status: DC
  Administered 2021-03-31 – 2021-04-04 (×14): 30 mg via INTRAVENOUS
  Filled 2021-03-31 (×15): qty 1

## 2021-03-31 MED ORDER — SODIUM CHLORIDE 0.9 % IV SOLN
100.0000 mg | Freq: Two times a day (BID) | INTRAVENOUS | Status: DC
  Administered 2021-03-31 – 2021-04-01 (×3): 100 mg via INTRAVENOUS
  Filled 2021-03-31 (×5): qty 100

## 2021-03-31 MED ORDER — SODIUM CHLORIDE 0.9 % IV SOLN
2.0000 g | Freq: Once | INTRAVENOUS | Status: AC
  Administered 2021-03-31: 2 g via INTRAVENOUS
  Filled 2021-03-31: qty 20

## 2021-03-31 MED ORDER — LACTATED RINGERS IV SOLN: INTRAVENOUS | Status: DC

## 2021-03-31 MED ORDER — ONDANSETRON HCL 4 MG/2ML IJ SOLN
4.0000 mg | Freq: Four times a day (QID) | INTRAMUSCULAR | Status: DC | PRN
  Administered 2021-04-02: 4 mg via INTRAVENOUS
  Filled 2021-03-31: qty 2

## 2021-03-31 MED ORDER — ONDANSETRON HCL 4 MG/2ML IJ SOLN
4.0000 mg | Freq: Once | INTRAMUSCULAR | Status: AC
  Administered 2021-03-31: 4 mg via INTRAVENOUS
  Filled 2021-03-31: qty 2

## 2021-03-31 MED ORDER — OXYCODONE-ACETAMINOPHEN 5-325 MG PO TABS: 1.0000 | ORAL_TABLET | ORAL | Status: DC | PRN

## 2021-03-31 MED ORDER — TRAMADOL HCL 50 MG PO TABS
50.0000 mg | ORAL_TABLET | Freq: Four times a day (QID) | ORAL | Status: DC | PRN
  Administered 2021-03-31 – 2021-04-04 (×8): 50 mg via ORAL
  Filled 2021-03-31 (×8): qty 1

## 2021-03-31 MED ORDER — MORPHINE SULFATE (PF) 4 MG/ML IV SOLN
4.0000 mg | Freq: Once | INTRAVENOUS | Status: AC
  Administered 2021-03-31: 4 mg via INTRAVENOUS
  Filled 2021-03-31: qty 1

## 2021-03-31 MED ORDER — SODIUM CHLORIDE 0.9 % IV BOLUS
1000.0000 mL | Freq: Once | INTRAVENOUS | Status: AC
  Administered 2021-03-31: 1000 mL via INTRAVENOUS

## 2021-03-31 MED ORDER — ONDANSETRON HCL 4 MG PO TABS
4.0000 mg | ORAL_TABLET | Freq: Four times a day (QID) | ORAL | Status: DC | PRN
  Administered 2021-04-01 (×2): 4 mg via ORAL
  Filled 2021-03-31 (×2): qty 1

## 2021-03-31 MED ORDER — SODIUM CHLORIDE 0.9 % IV SOLN
2.0000 g | Freq: Two times a day (BID) | INTRAVENOUS | Status: DC
  Administered 2021-03-31 – 2021-04-04 (×8): 2 g via INTRAVENOUS
  Filled 2021-03-31 (×9): qty 2

## 2021-03-31 MED ORDER — IBUPROFEN 600 MG PO TABS: 600.0000 mg | ORAL_TABLET | Freq: Four times a day (QID) | ORAL | Status: DC | PRN

## 2021-03-31 NOTE — ED Notes (Signed)
Pt sent to [facility] for ct.  Transported by [facility]

## 2021-03-31 NOTE — H&P (Incomplete)
Gina Fletcher is an 30 y.o. female. ***  Pertinent Gynecological History: Menses: {menses:16152} Bleeding: {uterine bleeding:32112} Contraception: {contraception:5051} DES exposure: {denies/unknown:32108} Blood transfusions: {none:33079} Sexually transmitted diseases: {std risk:32110} Previous GYN Procedures: {previous procedures:3041388}  Last mammogram: {normal/abnormal***:32111} Date: *** Last pap: {normal/abnormal***:32111} Date: *** OB History: G***, P***   Menstrual History: Menarche age: *** Patient's last menstrual period was 03/23/2021.    Past Medical History:  Diagnosis Date   GERD (gastroesophageal reflux disease)    Hyperlipidemia    Hypertension    Ovarian cyst    PTSD (post-traumatic stress disorder)     Past Surgical History:  Procedure Laterality Date   none     no surgical history    Family History  Adopted: Yes  Problem Relation Age of Onset   Hypertension Mother    Heart attack Mother     Social History:  reports that she has been smoking cigarettes. She has a 7.50 pack-year smoking history. She has never used smokeless tobacco. She reports current alcohol use of about 10.0 standard drinks per week. She reports current drug use. Drugs: Marijuana and Methamphetamines.  Allergies: No Known Allergies  Medications Prior to Admission  Medication Sig Dispense Refill Last Dose   predniSONE (DELTASONE) 10 MG tablet Day 1 take 6 tablets po qam. Day 2 take 5 tablets po qam. Day 3 take 4 tablets po qam. Day 4 take 3 tablets po qam. Day 5 take 2 tablets po qam. Day 6 take 1 tablet po qam. (Patient not taking: Reported on 03/31/2021) 21 tablet 0 Not Taking    Review of Systems  Blood pressure 122/86, pulse 95, temperature 99 F (37.2 C), temperature source Oral, resp. rate 18, height 5' (1.524 m), weight 72.6 kg, last menstrual period 03/23/2021, SpO2 100 %, unknown if currently breastfeeding. Physical Exam  Results for orders placed or  performed during the hospital encounter of 03/31/21 (from the past 24 hour(s))  Urinalysis, Routine w reflex microscopic Urine, Clean Catch     Status: Abnormal   Collection Time: 03/31/21  5:20 AM  Result Value Ref Range   Color, Urine YELLOW YELLOW   APPearance CLEAR CLEAR   Specific Gravity, Urine <1.005 (L) 1.005 - 1.030   pH 5.5 5.0 - 8.0   Glucose, UA NEGATIVE NEGATIVE mg/dL   Hgb urine dipstick LARGE (A) NEGATIVE   Bilirubin Urine NEGATIVE NEGATIVE   Ketones, ur NEGATIVE NEGATIVE mg/dL   Protein, ur NEGATIVE NEGATIVE mg/dL   Nitrite NEGATIVE NEGATIVE   Leukocytes,Ua TRACE (A) NEGATIVE  Urinalysis, Microscopic (reflex)     Status: None   Collection Time: 03/31/21  5:20 AM  Result Value Ref Range   RBC / HPF 11-20 0 - 5 RBC/hpf   WBC, UA 0-5 0 - 5 WBC/hpf   Bacteria, UA NONE SEEN NONE SEEN   Squamous Epithelial / LPF 11-20 0 - 5  hCG, quantitative, pregnancy     Status: None   Collection Time: 03/31/21  5:27 AM  Result Value Ref Range   hCG, Beta Chain, Quant, S 1 <5 mIU/mL  CBC with Differential/Platelet     Status: Abnormal   Collection Time: 03/31/21  5:27 AM  Result Value Ref Range   WBC 18.3 (H) 4.0 - 10.5 K/uL   RBC 3.57 (L) 3.87 - 5.11 MIL/uL   Hemoglobin 11.3 (L) 12.0 - 15.0 g/dL   HCT 62.3 (L) 76.2 - 83.1 %   MCV 98.3 80.0 - 100.0 fL   MCH 31.7 26.0 - 34.0  pg   MCHC 32.2 30.0 - 36.0 g/dL   RDW 93.7 90.2 - 40.9 %   Platelets 406 (H) 150 - 400 K/uL   nRBC 0.1 0.0 - 0.2 %   Neutrophils Relative % 85 %   Neutro Abs 15.4 (H) 1.7 - 7.7 K/uL   Lymphocytes Relative 9 %   Lymphs Abs 1.7 0.7 - 4.0 K/uL   Monocytes Relative 5 %   Monocytes Absolute 0.9 0.1 - 1.0 K/uL   Eosinophils Relative 0 %   Eosinophils Absolute 0.1 0.0 - 0.5 K/uL   Basophils Relative 0 %   Basophils Absolute 0.1 0.0 - 0.1 K/uL   Immature Granulocytes 1 %   Abs Immature Granulocytes 0.16 (H) 0.00 - 0.07 K/uL  Comprehensive metabolic panel     Status: Abnormal   Collection Time: 03/31/21   5:27 AM  Result Value Ref Range   Sodium 132 (L) 135 - 145 mmol/L   Potassium 3.3 (L) 3.5 - 5.1 mmol/L   Chloride 97 (L) 98 - 111 mmol/L   CO2 25 22 - 32 mmol/L   Glucose, Bld 146 (H) 70 - 99 mg/dL   BUN 7 6 - 20 mg/dL   Creatinine, Ser 7.35 0.44 - 1.00 mg/dL   Calcium 8.9 8.9 - 32.9 mg/dL   Total Protein 7.8 6.5 - 8.1 g/dL   Albumin 3.6 3.5 - 5.0 g/dL   AST 24 15 - 41 U/L   ALT 30 0 - 44 U/L   Alkaline Phosphatase 122 38 - 126 U/L   Total Bilirubin 0.5 0.3 - 1.2 mg/dL   GFR, Estimated >92 >42 mL/min   Anion gap 10 5 - 15  Lipase, blood     Status: None   Collection Time: 03/31/21  5:27 AM  Result Value Ref Range   Lipase 23 11 - 51 U/L  Resp Panel by RT-PCR (Flu A&B, Covid) Nasopharyngeal Swab     Status: None   Collection Time: 03/31/21  5:42 AM   Specimen: Nasopharyngeal Swab; Nasopharyngeal(NP) swabs in vial transport medium  Result Value Ref Range   SARS Coronavirus 2 by RT PCR NEGATIVE NEGATIVE   Influenza A by PCR NEGATIVE NEGATIVE   Influenza B by PCR NEGATIVE NEGATIVE    US OB LESS THAN 14 WEEKS W/ OB TRANSVAGINAL AND DOPPLER  Result Date: 03/30/2021 CLINICAL DATA:  Pelvic pain EXAM: OBSTETRIC <14 WK [place] AND TRANSVAGINAL OB US DOPPLER ULTRASOUND OF OVARIES TECHNIQUE: Both transabdominal and transvaginal ultrasound examinations were performed for complete evaluation of the gestation as well as the maternal uterus, adnexal regions, and pelvic cul-de-sac. Transvaginal technique was performed to assess early pregnancy. Color and duplex Doppler ultrasound was utilized to evaluate blood flow to the ovaries. COMPARISON:  None. FINDINGS: Intrauterine gestational sac: None Yolk sac:  Not Visualized. Embryo:  Not Visualized. Cardiac Activity: Not Visualized. Subchorionic hemorrhage:  None visualized. Maternal uterus/adnexae: Normal bilateral ovaries. Moderate amount of pelvic free fluid. Pulsed Doppler evaluation of both ovaries demonstrates normal appearing low-resistance arterial  and venous waveforms. IMPRESSION: 1. No intrauterine pregnancy is identified. Correlate with serial beta HCG. 2. Normal bilateral ovaries.  No ovarian torsion. Electronically Signed   By: Elige Ko M.D.   On: 03/30/2021 13:01    Assessment/Plan: ***  Scheryl Darter 03/31/2021, 6:20 PM

## 2021-03-31 NOTE — ED Provider Notes (Signed)
[facility] EMERGENCY DEPARTMENT Provider Note   CSN: [id number] Arrival date & time: 03/31/21  [id number]     History Chief Complaint  Patient presents with   Abdominal Pain    Gina Fletcher is a 30 y.o. female.  Patient presents with severe right-sided lower abdominal pain progressively worsening over the past 1 week.  States symptoms were initially in her upper abdomen but across to the right lower abdomen over the past several days.  Has had nausea and decreased appetite but no vomiting.  Has had diarrhea at home.  She was seen at [facility] yesterday and sent to [facility] because she had a positive pregnancy test but this was falsely positive.  Her ultrasound was negative and showed no evidence of ovarian torsion.  She was treated for STDs including Trichomonas.  CT scan was recommended but she declined this due to no insurance. Returns today with worsening lower abdominal pain, tachycardic and febrile on arrival.  Pain is very severe and spreads across her lower abdomen.  States she had a pelvic exam yesterday at [facility] and does not want another one today.  Did not know she had a fever.  Still has appendix and gallbladder  The history is provided by the patient.  Abdominal Pain Associated symptoms: fever, nausea and vomiting   Associated symptoms: no chest pain, no cough, no diarrhea, no dysuria, no hematuria and no shortness of breath       Past Medical History:  Diagnosis Date   GERD (gastroesophageal reflux disease)    Hyperlipidemia    Hypertension    Ovarian cyst    PTSD (post-traumatic stress disorder)     There are no problems to display for this patient.   Past Surgical History:  Procedure Laterality Date   none     no surgical history     OB History     Gravida  3   Para  2   Term  2   Preterm      AB  1   Living  2      SAB      IAB      Ectopic      Multiple      Live Births              Family History  Adopted: Yes  Problem Relation  Age of Onset   Hypertension Mother    Heart attack Mother     Social History   Tobacco Use   Smoking status: Some Days    Packs/day: 0.50    Years: 15.00    Pack years: 7.50    Types: Cigarettes   Smokeless tobacco: Never  Vaping Use   Vaping Use: Every day  Substance Use Topics   Alcohol use: Yes    Alcohol/week: 10.0 standard drinks    Types: 10 Cans of beer per week    Comment: drinks about 4-5 beers twice a week.    Drug use: Yes    Types: Marijuana, Methamphetamines    Comment: 2-3 blunts a week.  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    Home Medications Prior to Admission medications   Medication Sig Start Date End Date Taking? Authorizing Provider  predniSONE (DELTASONE) 10 MG tablet Day 1 take 6 tablets po qam. Day 2 take 5 tablets po qam. Day 3 take 4 tablets po qam. Day 4 take 3 tablets po qam. Day 5 take 2 tablets po qam. Day 6 take 1 tablet po qam.  03/08/21   Soyla Dryer, PA-C    Allergies    Patient has no known allergies.  Review of Systems   Review of Systems  Constitutional:  Positive for activity change, appetite change and fever.  HENT:  Negative for congestion and rhinorrhea.   Respiratory:  Negative for cough, chest tightness and shortness of breath.   Cardiovascular:  Negative for chest pain.  Gastrointestinal:  Positive for abdominal pain, nausea and vomiting. Negative for diarrhea.  Genitourinary:  Negative for dysuria and hematuria.  Musculoskeletal:  Negative for arthralgias, back pain and myalgias.  Skin:  Negative for rash.  Neurological:  Negative for dizziness, weakness and headaches.   all other systems are negative except as noted in the HPI and PMH.   Physical Exam Updated Vital Signs BP 127/76 (BP Location: Left Arm)   Pulse (!) 104   Temp (!) 100.8 F (38.2 C) (Oral)   Resp 20   Ht 5' (1.524 m)   Wt 72.6 kg   LMP 03/23/2021   SpO2 100%   BMI 31.25 kg/m   Physical Exam Vitals and nursing note reviewed.  Constitutional:      General:  She is in acute distress.     Appearance: She is well-developed.     Comments: uncomfortable  HENT:     Head: Normocephalic and atraumatic.     Mouth/Throat:     Pharynx: No oropharyngeal exudate.  Eyes:     Conjunctiva/sclera: Conjunctivae normal.     Pupils: Pupils are equal, round, and reactive to light.  Neck:     Comments: No meningismus. Cardiovascular:     Rate and Rhythm: Normal rate and regular rhythm.     Heart sounds: Normal heart sounds. No murmur heard. Pulmonary:     Effort: Pulmonary effort is normal. No respiratory distress.     Breath sounds: Normal breath sounds.  Abdominal:     Palpations: Abdomen is soft.     Tenderness: There is abdominal tenderness. There is guarding. There is no rebound.     Comments: Right lower quadrant and periumbilical tenderness with voluntary guarding.  Positive Rovsing sign, positive obturator sign.  Musculoskeletal:        General: No tenderness. Normal range of motion.     Cervical back: Normal range of motion and neck supple.  Skin:    General: Skin is warm.  Neurological:     Mental Status: She is alert and oriented to person, place, and time.     Cranial Nerves: No cranial nerve deficit.     Motor: No abnormal muscle tone.     Coordination: Coordination normal.     Comments:  5/5 strength throughout. CN 2-12 intact.Equal grip strength.   Psychiatric:        Behavior: Behavior normal.    ED Results / Procedures / Treatments   Labs (all labs ordered are listed, but only abnormal results are displayed) Labs Reviewed  URINALYSIS, ROUTINE W REFLEX MICROSCOPIC - Abnormal; Notable for the following components:      Result Value   Specific Gravity, Urine <1.005 (*)    Hgb urine dipstick LARGE (*)    Leukocytes,Ua TRACE (*)    All other components within normal limits  CBC WITH DIFFERENTIAL/PLATELET - Abnormal; Notable for the following components:   WBC 18.3 (*)    RBC 3.57 (*)    Hemoglobin 11.3 (*)    HCT 35.1 (*)     Platelets 406 (*)    Neutro Abs 15.4 (*)  Abs Immature Granulocytes 0.16 (*)    All other components within normal limits  COMPREHENSIVE METABOLIC PANEL - Abnormal; Notable for the following components:   Sodium 132 (*)    Potassium 3.3 (*)    Chloride 97 (*)    Glucose, Bld 146 (*)    All other components within normal limits  RESP PANEL BY RT-PCR (FLU A&B, COVID) ARPGX2  HCG, QUANTITATIVE, PREGNANCY  LIPASE, BLOOD  URINALYSIS, MICROSCOPIC (REFLEX)  GC/CHLAMYDIA PROBE AMP ([place]) NOT AT [facility]    EKG None  Radiology US OB LESS THAN 14 WEEKS W/ OB TRANSVAGINAL AND DOPPLER  Result Date: 03/30/2021 CLINICAL DATA:  Pelvic pain EXAM: OBSTETRIC <14 WK [place] AND TRANSVAGINAL OB US DOPPLER ULTRASOUND OF OVARIES TECHNIQUE: Both transabdominal and transvaginal ultrasound examinations were performed for complete evaluation of the gestation as well as the maternal uterus, adnexal regions, and pelvic cul-de-sac. Transvaginal technique was performed to assess early pregnancy. Color and duplex Doppler ultrasound was utilized to evaluate blood flow to the ovaries. COMPARISON:  None. FINDINGS: Intrauterine gestational sac: None Yolk sac:  Not Visualized. Embryo:  Not Visualized. Cardiac Activity: Not Visualized. Subchorionic hemorrhage:  None visualized. Maternal uterus/adnexae: Normal bilateral ovaries. Moderate amount of pelvic free fluid. Pulsed Doppler evaluation of both ovaries demonstrates normal appearing low-resistance arterial and venous waveforms. IMPRESSION: 1. No intrauterine pregnancy is identified. Correlate with serial beta HCG. 2. Normal bilateral ovaries.  No ovarian torsion. Electronically Signed   By: Elige Ko M.D.   On: 03/30/2021 13:01    Procedures Procedures   Medications Ordered in ED Medications  morphine 4 MG/ML injection 4 mg (has no administration in time range)  ondansetron (ZOFRAN) injection 4 mg (has no administration in time range)  sodium chloride 0.9 % bolus  1,000 mL (has no administration in time range)    ED Course  I have reviewed the triage vital signs and the nursing notes.  Pertinent labs & imaging results that were available during my care of the patient were reviewed by me and considered in my medical decision making (see chart for details).    MDM Rules/Calculators/A&P                          Progressively worsening right lower quadrant pain.  Febrile on arrival.  Work-up at [facility] yesterday was negative for pregnancy, ultrasound did show normal ovaries without evidence of torsion.  She was empirically treated for STDs and trichomonas.  Patient with fever and leukocytosis and tachycardia.  There is concern for appendicitis.  CT scanner is nonfunctional at this facility. Patient had ultrasound yesterday which showed no ovarian torsion or ovarian cysts.  Discussed with Dr. Lovell Sheehan high pretest probability for appendicitis.  He still requests CT scan prior to taking patient to surgery. Will need to transfer patient for CT scan  UA negative for infection.  Heart rate and fever have normalized.  Labs show leukocytosis.  Transfer for CT scan discussed with [facility] Dr. Candy Sledge.  Patient to return to [facility] after CT scan. She was given IV Rocephin and admitted p.o.  Dr. Jeraldine Loots aware of patient at shift change.  Final Clinical Impression(s) / ED Diagnoses Final diagnoses:  None    Rx / DC Orders ED Discharge Orders     None        Dale Ribeiro, Jeannett Senior, MD 03/31/21 [phone]

## 2021-03-31 NOTE — ED Notes (Signed)
Patient transported to [facility] for CT Scan.  Unable to obtain vital signs.

## 2021-03-31 NOTE — ED Notes (Signed)
Attempted to give report on pt. RN was unable to take report at this time

## 2021-03-31 NOTE — ED Provider Notes (Signed)
The patient assumed at signout.  On my initial evaluation patient is complaining of severe pain, additional narcotics provided.  She has returned from another facility where she went for CT scan.   I spoke w radiology.  CT w TOA, report below:  IMPRESSION: 1. There is extensive inflammatory fat stranding in the low pelvis and about the bilateral adnexa. Centered in the vicinity of the right ovary there is a septated fluid collection measuring 4.2 x 3.3 cm. Centered in the vicinity of the left ovary there is a septated fluid collection measuring 8.1 x 4.2 cm. Findings are most consistent with tubo-ovarian abscess. 2. Appendix closely abuts the right ovary but is itself normal in caliber and appearance. 3. Wall thickening and fat stranding of the mid to distal sigmoid colon and rectum, likely reactive to adjacent inflammation. 4. Prominent bilateral iliac and retroperitoneal lymph nodes, likely reactive.   : Discussed the patient CT results with her surgeon, and subsequent with radiology, and then with our colleague at gynecology, Dr. Debroah Loop. With concern for tubo-ovarian abscess patient will continue IV antibiotics, narcotics, will require transfer to [facility] hospital for monitoring, management ongoing resuscitation. Patient is aware of this, agreeable to plan.  CRITICAL CARE Performed by: Gerhard Munch Total critical care time: 35 minutes Critical care time was exclusive of separately billable procedures and treating other patients. Critical care was necessary to treat or prevent imminent or life-threatening deterioration. Critical care was time spent personally by me on the following activities: development of treatment plan with patient and/or surrogate as well as nursing, discussions with consultants, evaluation of patient's response to treatment, examination of patient, obtaining history from patient or surrogate, ordering and performing treatments and interventions, ordering and  review of laboratory studies, ordering and review of radiographic studies, pulse oximetry and re-evaluation of patient's condition.    Gerhard Munch, MD 03/31/21 1350

## 2021-03-31 NOTE — ED Notes (Signed)
Attempted to give report on pt. RN was unable to take report

## 2021-03-31 NOTE — H&P (Signed)
Gina Fletcher is an 30 y.o. female. [id number] Patient's last menstrual period was 03/23/2021. Gina Fletcher is a 30 y.o. female.   Patient presented to [facility] with severe right-sided lower abdominal pain progressively worsening over the past 1 week.  States symptoms were initially in her upper abdomen but across to the right lower abdomen over the past several days.  Has had nausea and decreased appetite but no vomiting.  Has had diarrhea at home.  She was seen at [facility] yesterday and sent to [facility] because she had a positive pregnancy test but this was falsely positive.  Her ultrasound was negative and showed no evidence of ovarian torsion.  She was treated for STDs including Trichomonas.  CT scan was recommended but she declined this due to no insurance. Returns today with worsening lower abdominal pain, tachycardic and febrile on arrival.  Pain is very severe and spreads across her lower abdomen.  States she had a pelvic exam yesterday at [facility] and does not want another one today.  Did not know she had a fever.  Still has appendix and gallbladder She had a CT done in [place] with the result as noted, transferred for treatment of TOA Pertinent Gynecological History:  Contraception: none DES exposure:  Blood transfusions:  Sexually transmitted diseases: Trichomonas, gonorrhea Previous GYN Procedures:   Last mammogram:  Date:   Menstrual History:  Patient's last menstrual period was 03/23/2021.    Past Medical History:  Diagnosis Date   GERD (gastroesophageal reflux disease)    Hyperlipidemia    Hypertension    Ovarian cyst    PTSD (post-traumatic stress disorder)     Past Surgical History:  Procedure Laterality Date   none     no surgical history    Family History  Adopted: Yes  Problem Relation Age of Onset   Hypertension Mother    Heart attack Mother     Social History:  reports that she has been smoking cigarettes. She has a 7.50 pack-year smoking history. She has never  used smokeless tobacco. She reports current alcohol use of about 10.0 standard drinks per week. She reports current drug use. Drugs: Marijuana and Methamphetamines.  Allergies: No Known Allergies  Medications Prior to Admission  Medication Sig Dispense Refill Last Dose   predniSONE (DELTASONE) 10 MG tablet Day 1 take 6 tablets po qam. Day 2 take 5 tablets po qam. Day 3 take 4 tablets po qam. Day 4 take 3 tablets po qam. Day 5 take 2 tablets po qam. Day 6 take 1 tablet po qam. (Patient not taking: Reported on 03/31/2021) 21 tablet 0 Not Taking    Review of Systems  Constitutional:  Positive for chills and fever. Negative for activity change.  Gastrointestinal:  Positive for abdominal pain and constipation.  Genitourinary:  Positive for pelvic pain. Negative for menstrual problem, vaginal bleeding and vaginal discharge.   Blood pressure 122/86, pulse 95, temperature 99 F (37.2 C), temperature source Oral, resp. rate 18, height 5' (1.524 m), weight 72.6 kg, last menstrual period 03/23/2021, SpO2 100 %, unknown if currently breastfeeding. Physical Exam Vitals and nursing note reviewed.  Constitutional:      Appearance: She is obese. She is not ill-appearing.  Cardiovascular:     Rate and Rhythm: Normal rate.  Pulmonary:     Effort: Pulmonary effort is normal.  Abdominal:     General: Abdomen is flat.     Palpations: Abdomen is soft.     Tenderness: There is abdominal tenderness (  lower quadrants). There is no guarding or rebound.  Skin:    General: Skin is warm and dry.  Neurological:     General: No focal deficit present.     Mental Status: She is alert.    Results for orders placed or performed during the hospital encounter of 03/31/21 (from the past 24 hour(s))  Urinalysis, Routine w reflex microscopic Urine, Clean Catch     Status: Abnormal   Collection Time: 03/31/21  5:20 AM  Result Value Ref Range   Color, Urine YELLOW YELLOW   APPearance CLEAR CLEAR   Specific Gravity,  Urine <1.005 (L) 1.005 - 1.030   pH 5.5 5.0 - 8.0   Glucose, UA NEGATIVE NEGATIVE mg/dL   Hgb urine dipstick LARGE (A) NEGATIVE   Bilirubin Urine NEGATIVE NEGATIVE   Ketones, ur NEGATIVE NEGATIVE mg/dL   Protein, ur NEGATIVE NEGATIVE mg/dL   Nitrite NEGATIVE NEGATIVE   Leukocytes,Ua TRACE (A) NEGATIVE  Urinalysis, Microscopic (reflex)     Status: None   Collection Time: 03/31/21  5:20 AM  Result Value Ref Range   RBC / HPF 11-20 0 - 5 RBC/hpf   WBC, UA 0-5 0 - 5 WBC/hpf   Bacteria, UA NONE SEEN NONE SEEN   Squamous Epithelial / LPF 11-20 0 - 5  hCG, quantitative, pregnancy     Status: None   Collection Time: 03/31/21  5:27 AM  Result Value Ref Range   hCG, Beta Chain, Quant, S 1 <5 mIU/mL  CBC with Differential/Platelet     Status: Abnormal   Collection Time: 03/31/21  5:27 AM  Result Value Ref Range   WBC 18.3 (H) 4.0 - 10.5 K/uL   RBC 3.57 (L) 3.87 - 5.11 MIL/uL   Hemoglobin 11.3 (L) 12.0 - 15.0 g/dL   HCT 60.7 (L) 37.1 - 06.2 %   MCV 98.3 80.0 - 100.0 fL   MCH 31.7 26.0 - 34.0 pg   MCHC 32.2 30.0 - 36.0 g/dL   RDW 69.4 85.4 - 62.7 %   Platelets 406 (H) 150 - 400 K/uL   nRBC 0.1 0.0 - 0.2 %   Neutrophils Relative % 85 %   Neutro Abs 15.4 (H) 1.7 - 7.7 K/uL   Lymphocytes Relative 9 %   Lymphs Abs 1.7 0.7 - 4.0 K/uL   Monocytes Relative 5 %   Monocytes Absolute 0.9 0.1 - 1.0 K/uL   Eosinophils Relative 0 %   Eosinophils Absolute 0.1 0.0 - 0.5 K/uL   Basophils Relative 0 %   Basophils Absolute 0.1 0.0 - 0.1 K/uL   Immature Granulocytes 1 %   Abs Immature Granulocytes 0.16 (H) 0.00 - 0.07 K/uL  Comprehensive metabolic panel     Status: Abnormal   Collection Time: 03/31/21  5:27 AM  Result Value Ref Range   Sodium 132 (L) 135 - 145 mmol/L   Potassium 3.3 (L) 3.5 - 5.1 mmol/L   Chloride 97 (L) 98 - 111 mmol/L   CO2 25 22 - 32 mmol/L   Glucose, Bld 146 (H) 70 - 99 mg/dL   BUN 7 6 - 20 mg/dL   Creatinine, Ser 0.35 0.44 - 1.00 mg/dL   Calcium 8.9 8.9 - 00.9 mg/dL    Total Protein 7.8 6.5 - 8.1 g/dL   Albumin 3.6 3.5 - 5.0 g/dL   AST 24 15 - 41 U/L   ALT 30 0 - 44 U/L   Alkaline Phosphatase 122 38 - 126 U/L   Total Bilirubin 0.5 0.3 -  1.2 mg/dL   GFR, Estimated >40 >98 mL/min   Anion gap 10 5 - 15  Lipase, blood     Status: None   Collection Time: 03/31/21  5:27 AM  Result Value Ref Range   Lipase 23 11 - 51 U/L  Resp Panel by RT-PCR (Flu A&B, Covid) Nasopharyngeal Swab     Status: None   Collection Time: 03/31/21  5:42 AM   Specimen: Nasopharyngeal Swab; Nasopharyngeal(NP) swabs in vial transport medium  Result Value Ref Range   SARS Coronavirus 2 by RT PCR NEGATIVE NEGATIVE   Influenza A by PCR NEGATIVE NEGATIVE   Influenza B by PCR NEGATIVE NEGATIVE    US OB LESS THAN 14 WEEKS W/ OB TRANSVAGINAL AND DOPPLER  Result Date: 03/30/2021 CLINICAL DATA:  Pelvic pain EXAM: OBSTETRIC <14 WK [place] AND TRANSVAGINAL OB US DOPPLER ULTRASOUND OF OVARIES TECHNIQUE: Both transabdominal and transvaginal ultrasound examinations were performed for complete evaluation of the gestation as well as the maternal uterus, adnexal regions, and pelvic cul-de-sac. Transvaginal technique was performed to assess early pregnancy. Color and duplex Doppler ultrasound was utilized to evaluate blood flow to the ovaries. COMPARISON:  None. FINDINGS: Intrauterine gestational sac: None Yolk sac:  Not Visualized. Embryo:  Not Visualized. Cardiac Activity: Not Visualized. Subchorionic hemorrhage:  None visualized. Maternal uterus/adnexae: Normal bilateral ovaries. Moderate amount of pelvic free fluid. Pulsed Doppler evaluation of both ovaries demonstrates normal appearing low-resistance arterial and venous waveforms. IMPRESSION: 1. No intrauterine pregnancy is identified. Correlate with serial beta HCG. 2. Normal bilateral ovaries.  No ovarian torsion. Electronically Signed   By: Elige Ko M.D.   On: 03/30/2021 13:01   03/31/2021 9:51 AM EST   1. There is extensive inflammatory fat  stranding in the low pelvis and about the bilateral adnexa. Centered in the vicinity of the right ovary there is a septated fluid collection measuring 4.2 x 3.3 cm. Centered in the vicinity of the left ovary there is a septated fluid collection measuring 8.1 x 4.2 cm. Findings are most consistent with tubo-ovarian abscess. 2. Appendix closely abuts the right ovary but is itself normal in caliber and appearance. 3. Wall thickening and fat stranding of the mid to distal sigmoid colon and rectum, likely reactive to adjacent inflammation. 4. Prominent bilateral iliac and retroperitoneal lymph nodes, likely reactive.  These results will be called to the ordering clinician or representative by the Radiologist Assistant, and communication documented in the PACS or [employer].   Electronically Signed  By: Jearld Lesch M.D.  On: 03/31/2021 09:51      Imaging Results - CT Abdomen Pelvis W Contrast (03/31/2021 9:30 AM EST) Narrative  03/31/2021 9:51 AM EST   CLINICAL DATA:  Severe right lower quadrant and periumbilical pain, recent positive beta HCG without sonographic evidence of pregnancy  EXAM: CT ABDOMEN AND PELVIS WITH CONTRAST  TECHNIQUE: Multidetector CT imaging of the abdomen and pelvis was performed using the standard protocol following bolus administration of intravenous contrast.  CONTRAST:  80 mL Omnipaque 350 iodinated contrast IV  COMPARISON:  Pelvic ultrasound, 03/30/2021  FINDINGS: Lower chest: No acute abnormality.  Hepatobiliary: No solid liver abnormality is seen. No gallstones, gallbladder wall thickening, or biliary dilatation.  Pancreas: Unremarkable. No pancreatic ductal dilatation or surrounding inflammatory changes.  Spleen: Normal in size without significant abnormality.  Adrenals/Urinary Tract: Adrenal glands are unremarkable. Kidneys are normal, without renal calculi, solid lesion, or hydronephrosis. Bladder is  unremarkable.  Stomach/Bowel: Stomach is within normal limits. Appendix closely abuts a  fluid collection centered within the right ovary but is normal in caliber and appearance (series 5, image 80, series 6, image 79). Wall thickening and fat stranding of the mid to distal sigmoid colon and rectum, likely reactive to adjacent inflammation (series 2, image 59).  Vascular/Lymphatic: No significant vascular findings are present. Prominent bilateral iliac and retroperitoneal lymph nodes.  Reproductive: There is extensive inflammatory fat stranding in the low pelvis and about the adnexa. Centered in the vicinity of the right ovary there is a septated fluid collection measuring 4.2 x 3.3 cm (series 2, image 61). Centered about the left ovary there is a septated fluid collection measuring 8.1 x 4.2 cm (series 2, image 64).  Other: No abdominal wall hernia or abnormality. No abdominopelvic ascites.  Musculoskeletal: No acute or significant osseous findings.      Imaging Results - CT Abdomen Pelvis W Contrast (03/31/2021 9:30 AM EST) Procedure Note  Haig Prophet, MD - 03/31/2021   Formatting of this note might be different from the original. CLINICAL DATA: Severe right lower quadrant and periumbilical pain, recent positive beta HCG without sonographic evidence of pregnancy  EXAM: CT ABDOMEN AND PELVIS WITH CONTRAST  TECHNIQUE: Multidetector CT imaging of the abdomen and pelvis was performed using the standard protocol following bolus administration of intravenous contrast.  CONTRAST: 80 mL Omnipaque 350 iodinated contrast IV  COMPARISON: Pelvic ultrasound, 03/30/2021  FINDINGS: Lower chest: No acute abnormality.  Hepatobiliary: No solid liver abnormality is seen. No gallstones, gallbladder wall thickening, or biliary dilatation.  Pancreas: Unremarkable. No pancreatic ductal dilatation or surrounding inflammatory changes.  Spleen: Normal in size without significant  abnormality.  Adrenals/Urinary Tract: Adrenal glands are unremarkable. Kidneys are normal, without renal calculi, solid lesion, or hydronephrosis. Bladder is unremarkable.  Stomach/Bowel: Stomach is within normal limits. Appendix closely abuts a fluid collection centered within the right ovary but is normal in caliber and appearance (series 5, image 80, series 6, image 79). Wall thickening and fat stranding of the mid to distal sigmoid colon and rectum, likely reactive to adjacent inflammation (series 2, image 59).  Vascular/Lymphatic: No significant vascular findings are present. Prominent bilateral iliac and retroperitoneal lymph nodes.  Reproductive: There is extensive inflammatory fat stranding in the low pelvis and about the adnexa. Centered in the vicinity of the right ovary there is a septated fluid collection measuring 4.2 x 3.3 cm (series 2, image 61). Centered about the left ovary there is a septated fluid collection measuring 8.1 x 4.2 cm (series 2, image 64).  Other: No abdominal wall hernia or abnormality. No abdominopelvic ascites.  Musculoskeletal: No acute or significant osseous findings.  IMPRESSION: 1. There is extensive inflammatory fat stranding in the low pelvis and about the bilateral adnexa. Centered in the vicinity of the right ovary there is a septated fluid collection measuring 4.2 x 3.3 cm. Centered in the vicinity of the left ovary there is a septated fluid collection measuring 8.1 x 4.2 cm. Findings are most consistent with tubo-ovarian abscess. 2. Appendix closely abuts the right ovary but is itself normal in caliber and appearance. 3. Wall thickening and fat stranding of the mid to distal sigmoid colon and rectum, likely reactive to adjacent inflammation. 4. Prominent bilateral iliac and retroperitoneal lymph nodes, likely reactive.  These results will be called to the ordering clinician or representative by the Radiologist Assistant, and  communication documented in the PACS or [employer].   Electronically Signed By: Jearld Lesch M.D. On: 03/31/2021 09:51   Assessment/Plan: Bilateral  TOA for admission for IV antibiotics and possible IR drainage   cefoTEtan (CEFOTAN) IV     doxycycline (VIBRAMYCIN) IV     lactated ringers        Scheryl Darter 03/31/2021, 6:35 PM

## 2021-03-31 NOTE — ED Notes (Signed)
Returned from [employer]

## 2021-03-31 NOTE — ED Triage Notes (Signed)
Pov from home with cc of RLQ pain that is worsening. Thinks its her appendix. Was supposed to get CT yesterday but refused rt insurance. Said she was dx with an STI due to her fiance's infidelity.

## 2021-04-01 ENCOUNTER — Encounter (HOSPITAL_COMMUNITY): Payer: Self-pay | Admitting: Obstetrics and Gynecology

## 2021-04-01 DIAGNOSIS — L0291 Cutaneous abscess, unspecified: Secondary | ICD-10-CM

## 2021-04-01 DIAGNOSIS — R1031 Right lower quadrant pain: Secondary | ICD-10-CM

## 2021-04-01 LAB — GC/CHLAMYDIA PROBE AMP (~~LOC~~) NOT AT ARMC
Chlamydia: NEGATIVE
Comment: NEGATIVE
Comment: NORMAL
Neisseria Gonorrhea: POSITIVE — AB

## 2021-04-01 LAB — CBC WITH DIFFERENTIAL/PLATELET
Abs Immature Granulocytes: 0.17 10*3/uL — ABNORMAL HIGH (ref 0.00–0.07)
Basophils Absolute: 0 10*3/uL (ref 0.0–0.1)
Basophils Relative: 0 %
Eosinophils Absolute: 0.1 10*3/uL (ref 0.0–0.5)
Eosinophils Relative: 0 %
HCT: 29.5 % — ABNORMAL LOW (ref 36.0–46.0)
Hemoglobin: 9.6 g/dL — ABNORMAL LOW (ref 12.0–15.0)
Immature Granulocytes: 1 %
Lymphocytes Relative: 9 %
Lymphs Abs: 1.5 10*3/uL (ref 0.7–4.0)
MCH: 31.7 pg (ref 26.0–34.0)
MCHC: 32.5 g/dL (ref 30.0–36.0)
MCV: 97.4 fL (ref 80.0–100.0)
Monocytes Absolute: 1.1 10*3/uL — ABNORMAL HIGH (ref 0.1–1.0)
Monocytes Relative: 7 %
Neutro Abs: 12.8 10*3/uL — ABNORMAL HIGH (ref 1.7–7.7)
Neutrophils Relative %: 83 %
Platelets: 333 10*3/uL (ref 150–400)
RBC: 3.03 MIL/uL — ABNORMAL LOW (ref 3.87–5.11)
RDW: 12.3 % (ref 11.5–15.5)
WBC: 15.7 10*3/uL — ABNORMAL HIGH (ref 4.0–10.5)
nRBC: 0 % (ref 0.0–0.2)

## 2021-04-01 MED ORDER — LACTATED RINGERS IV SOLN: INTRAVENOUS | Status: DC

## 2021-04-01 MED ORDER — DIPHENHYDRAMINE HCL 25 MG PO CAPS
25.0000 mg | ORAL_CAPSULE | Freq: Every evening | ORAL | Status: DC | PRN
  Administered 2021-04-01: 25 mg via ORAL
  Filled 2021-04-01: qty 1

## 2021-04-01 MED ORDER — POTASSIUM CHLORIDE CRYS ER 20 MEQ PO TBCR
20.0000 meq | EXTENDED_RELEASE_TABLET | Freq: Two times a day (BID) | ORAL | Status: AC
  Administered 2021-04-01 – 2021-04-03 (×6): 20 meq via ORAL
  Filled 2021-04-01 (×6): qty 1

## 2021-04-01 MED ORDER — HYDROMORPHONE HCL 1 MG/ML IJ SOLN
1.0000 mg | Freq: Once | INTRAMUSCULAR | Status: AC
  Administered 2021-04-01: 1 mg via INTRAVENOUS
  Filled 2021-04-01: qty 1

## 2021-04-01 NOTE — Progress Notes (Signed)
Gynecology Progress Note  Admission Date: 03/31/2021 Current Date: 04/01/2021 1:51 PM  Gina Fletcher is a 30 y.o. [id number] HD#2 admitted for tubo-ovarian abscess   History complicated by: Patient Active Problem List   Diagnosis Date Noted   Tubo-ovarian abscess 03/31/2021   TOA (tubo-ovarian abscess) 03/31/2021    ROS and patient/family/surgical history, located on admission H&P note dated 03/31/2021, have been reviewed, and there are no changes except as noted below Yesterday/Overnight Events:  None significant  Subjective:  Pt seen.  Pain currently well controlled.  Pt denies fever/chills.  She does note moderate headache and decreased appetite.  She currently denies any abdominal pain.  Discussed disease etiology and current treatment plan and options.  Objective:   Vitals:   03/31/21 2313 04/01/21 0351 04/01/21 0742 04/01/21 1208  BP: 131/77 135/86 94/68 111/75  Pulse: 86 80 87 70  Resp:  18 18 16   Temp: 98.2 F (36.8 C) 97.7 F (36.5 C) 97.9 F (36.6 C) 97.8 F (36.6 C)  TempSrc: Oral Oral Oral Oral  SpO2: 99% 99% 100% 100%  Weight:      Height:        Temp:  [97.7 F (36.5 C)-102.3 F (39.1 C)] 97.8 F (36.6 C) (12/01 1208) Pulse Rate:  [70-111] 70 (12/01 1208) Resp:  [16-20] 16 (12/01 1208) BP: (94-135)/(68-88) 111/75 (12/01 1208) SpO2:  [97 %-100 %] 100 % (12/01 1208) I/O last 3 completed shifts: In: 640 [P.O.:440; IV Piggyback:200] Out: 500 [Urine:500] Total I/O In: 240 [P.O.:240] Out: 100 [Urine:100]  Intake/Output Summary (Last 24 hours) at 04/01/2021 1351 Last data filed at 04/01/2021 1200 Gross per 24 hour  Intake 680 ml  Output 600 ml  Net 80 ml     Current Vital Signs 24h Vital Sign Ranges  T 97.8 F (36.6 C) Temp  Avg: 98.9 F (37.2 C)  Min: 97.7 F (36.5 C)  Max: 102.3 F (39.1 C)  BP 111/75 BP  Min: 94/68  Max: 135/86  HR 70 Pulse  Avg: 91.1  Min: 70  Max: 111  RR 16 Resp  Avg: 18  Min: 16  Max: 20  SaO2 100 %  (Room Air) SpO2   Avg: 99.2 %  Min: 97 %  Max: 100 %       24 Hour I/O Current Shift I/O  Time Ins Outs 11/30 0701 - 12/01 0700 In: 640 [P.O.:440] Out: 500 [Urine:500] 12/01 0701 - 12/01 1900 In: 240 [P.O.:240] Out: 100 [Urine:100]   Patient Vitals for the past 12 hrs:  BP Temp Temp src Pulse Resp SpO2  04/01/21 1208 111/75 97.8 F (36.6 C) Oral 70 16 100 %  04/01/21 0742 94/68 97.9 F (36.6 C) Oral 87 18 100 %  04/01/21 0351 135/86 97.7 F (36.5 C) Oral 80 18 99 %     Patient Vitals for the past 24 hrs:  BP Temp Temp src Pulse Resp SpO2  04/01/21 1208 111/75 97.8 F (36.6 C) Oral 70 16 100 %  04/01/21 0742 94/68 97.9 F (36.6 C) Oral 87 18 100 %  04/01/21 0351 135/86 97.7 F (36.5 C) Oral 80 18 99 %  03/31/21 2313 131/77 98.2 F (36.8 C) Oral 86 -- 99 %  03/31/21 2049 118/76 99.1 F (37.3 C) Oral 90 18 100 %  03/31/21 1749 122/86 99 F (37.2 C) Oral 95 18 100 %  03/31/21 1630 124/79 (!) 102.3 F (39.1 C) -- (!) 103 18 100 %  03/31/21 1600 126/88 -- -- 98 --  98 %  03/31/21 1440 121/84 -- -- (!) 111 20 97 %    Physical exam: General appearance: alert, cooperative, appears stated age, and no distress Abdomen:  soft, nondistended, mild bilateral lower abdominal tenderness, no rebound or guarding, not a surgical abdomen GU: No gross VB Lungs: clear to auscultation bilaterally Heart: regular rate and rhythm Extremities: no lower extremity edema, no calf pain bilaterally Skin: WNL Psych: appropriate Neurologic: Grossly normal  Medications Current Facility-Administered Medications  Medication Dose Route Frequency Provider Last Rate Last Admin   cefoTEtan (CEFOTAN) 2 g in sodium chloride 0.9 % 100 mL IVPB  2 g Intravenous Q12H Adam Phenix, MD 200 mL/hr at 04/01/21 0757 2 g at 04/01/21 0757   doxycycline (VIBRAMYCIN) 100 mg in sodium chloride 0.9 % 250 mL IVPB  100 mg Intravenous Q12H Adam Phenix, MD 125 mL/hr at 04/01/21 0900 100 mg at 04/01/21 0900   ketorolac (TORADOL) 30  MG/ML injection 30 mg  30 mg Intravenous Q6H Adam Phenix, MD   30 mg at 04/01/21 1023   lactated ringers infusion   Intravenous Continuous Adam Phenix, MD 75 mL/hr at 04/01/21 1326 New Bag at 04/01/21 1326   morphine 4 MG/ML injection 4 mg  4 mg Intravenous Q2H PRN Gerhard Munch, MD   4 mg at 03/31/21 1424   ondansetron (ZOFRAN) tablet 4 mg  4 mg Oral Q6H PRN Adam Phenix, MD   4 mg at 04/01/21 1348   Or   ondansetron (ZOFRAN) injection 4 mg  4 mg Intravenous Q6H PRN Adam Phenix, MD       traMADol Janean Sark) tablet 50 mg  50 mg Oral Q6H PRN Adam Phenix, MD   50 mg at 04/01/21 0753      Labs  Recent Labs  Lab 03/30/21 0921 03/31/21 0527 04/01/21 0512  WBC 19.9* 18.3* 15.7*  HGB 11.5* 11.3* 9.6*  HCT 35.7* 35.1* 29.5*  PLT 433* 406* 333    Recent Labs  Lab 03/30/21 0921 03/31/21 0527  NA 133* 132*  K 4.1 3.3*  CL 101 97*  CO2 22 25  BUN 7 7  CREATININE 0.84 0.75  CALCIUM 9.1 8.9  PROT 7.3 7.8  BILITOT 0.5 0.5  ALKPHOS 136* 122  ALT 39 30  AST 44* 24  GLUCOSE 132* 146*    Radiology No new scans  Assessment & Plan:  PID/ TOA  Continue IV antibiotics, will consider oral doxycycline starting on 04/02/21 Positive gonorrhea noted on swab Have consulted interventional radiology regarding possible CT guided drainage if possible.  Awaiting decision from the team Reassess in AM, recheck CBC in AM Replace potassium with oral KDUR  Code Status: Full Code  Moderate decision making Mariel Aloe, MD Attending Center for [facility])

## 2021-04-01 NOTE — Progress Notes (Signed)
IR contacted by Dr. Donavan Foil for assistance with managing patient's TOA. Case/imaging reviewed by Dr. Juliette Alcide who states there is a small window into which a percutaneous drain might safely be placed but he would like to defer to the radiologist who will be at [facility] tomorrow. This request is currently in review with Dr. Lowella Dandy.   Orders placed for patient to be NPO at midnight. AM lab orders in place. Patient is tentatively scheduled for image-guided percutaneous abscess aspiration with possible drain placement 04/02/21 pending approval. If approved the patient will be seen/consented in the morning.   Please call IR with any questions.  Alwyn Ren, [place] [phone] 04/01/2021, 3:57 PM

## 2021-04-02 ENCOUNTER — Inpatient Hospital Stay (HOSPITAL_COMMUNITY): Payer: Self-pay

## 2021-04-02 ENCOUNTER — Other Ambulatory Visit (HOSPITAL_COMMUNITY): Payer: Self-pay

## 2021-04-02 LAB — CBC WITH DIFFERENTIAL/PLATELET
Abs Immature Granulocytes: 0.16 10*3/uL — ABNORMAL HIGH (ref 0.00–0.07)
Basophils Absolute: 0 10*3/uL (ref 0.0–0.1)
Basophils Relative: 0 %
Eosinophils Absolute: 0.1 10*3/uL (ref 0.0–0.5)
Eosinophils Relative: 1 %
HCT: 29.9 % — ABNORMAL LOW (ref 36.0–46.0)
Hemoglobin: 9.7 g/dL — ABNORMAL LOW (ref 12.0–15.0)
Immature Granulocytes: 1 %
Lymphocytes Relative: 12 %
Lymphs Abs: 1.6 10*3/uL (ref 0.7–4.0)
MCH: 31.2 pg (ref 26.0–34.0)
MCHC: 32.4 g/dL (ref 30.0–36.0)
MCV: 96.1 fL (ref 80.0–100.0)
Monocytes Absolute: 0.9 10*3/uL (ref 0.1–1.0)
Monocytes Relative: 7 %
Neutro Abs: 10.4 10*3/uL — ABNORMAL HIGH (ref 1.7–7.7)
Neutrophils Relative %: 79 %
Platelets: 366 10*3/uL (ref 150–400)
RBC: 3.11 MIL/uL — ABNORMAL LOW (ref 3.87–5.11)
RDW: 12.2 % (ref 11.5–15.5)
WBC: 13.1 10*3/uL — ABNORMAL HIGH (ref 4.0–10.5)
nRBC: 0 % (ref 0.0–0.2)

## 2021-04-02 LAB — PROTIME-INR
INR: 1.2 (ref 0.8–1.2)
Prothrombin Time: 14.9 seconds (ref 11.4–15.2)

## 2021-04-02 LAB — TYPE AND SCREEN
ABO/RH(D): O POS
Antibody Screen: NEGATIVE

## 2021-04-02 LAB — ABO/RH: ABO/RH(D): O POS

## 2021-04-02 MED ORDER — METRONIDAZOLE 500 MG PO TABS
500.0000 mg | ORAL_TABLET | Freq: Two times a day (BID) | ORAL | Status: DC
  Administered 2021-04-02 – 2021-04-04 (×5): 500 mg via ORAL
  Filled 2021-04-02 (×5): qty 1

## 2021-04-02 MED ORDER — DOXYCYCLINE HYCLATE 100 MG PO TABS
100.0000 mg | ORAL_TABLET | Freq: Two times a day (BID) | ORAL | Status: DC
  Administered 2021-04-02 – 2021-04-04 (×5): 100 mg via ORAL
  Filled 2021-04-02 (×5): qty 1

## 2021-04-02 MED ORDER — MIDAZOLAM HCL 2 MG/2ML IJ SOLN
INTRAMUSCULAR | Status: AC
  Filled 2021-04-02: qty 4

## 2021-04-02 MED ORDER — METRONIDAZOLE 500 MG PO TABS
500.0000 mg | ORAL_TABLET | Freq: Two times a day (BID) | ORAL | 0 refills | Status: AC
  Filled 2021-04-02: qty 12, 6d supply, fill #0

## 2021-04-02 MED ORDER — SODIUM CHLORIDE 0.9% FLUSH
5.0000 mL | Freq: Three times a day (TID) | INTRAVENOUS | Status: DC
  Administered 2021-04-02 – 2021-04-04 (×5): 5 mL

## 2021-04-02 MED ORDER — DOXYCYCLINE HYCLATE 100 MG PO TABS
100.0000 mg | ORAL_TABLET | Freq: Two times a day (BID) | ORAL | 0 refills | Status: AC
  Filled 2021-04-02: qty 28, 14d supply, fill #0

## 2021-04-02 MED ORDER — FENTANYL CITRATE (PF) 100 MCG/2ML IJ SOLN
INTRAMUSCULAR | Status: AC | PRN
  Administered 2021-04-02: 25 ug via INTRAVENOUS
  Administered 2021-04-02: 50 ug via INTRAVENOUS
  Administered 2021-04-02: 25 ug via INTRAVENOUS
  Administered 2021-04-02: 50 ug via INTRAVENOUS

## 2021-04-02 MED ORDER — FENTANYL CITRATE (PF) 100 MCG/2ML IJ SOLN
INTRAMUSCULAR | Status: AC
  Filled 2021-04-02: qty 4

## 2021-04-02 MED ORDER — MIDAZOLAM HCL 2 MG/2ML IJ SOLN
INTRAMUSCULAR | Status: AC | PRN
  Administered 2021-04-02 (×2): .5 mg via INTRAVENOUS
  Administered 2021-04-02 (×2): 1 mg via INTRAVENOUS

## 2021-04-02 NOTE — Consult Note (Signed)
Chief Complaint: Patient was seen in consultation today for  Tubo-ovarian abscess at the request of Dr. Donavan Foil  Supervising Physician: Richarda Overlie  Patient Status: [facility] - In-pt  History of Present Illness: Gina Fletcher is a 30 y.o. female admitted with a tubo-ovarian abscess.  She has had RLQ abdominal pain for 1 week.  She endorses nausea and decreased appetite but no vomiting.  She has been tachycardic and febrile.  Pt reports she has no current pain.  She endorses being anxious about the procedure.  She reports only complaints are painful urination/blood in urine and constipation.  IR has been consulted for the placement of a drain for the [facility]  Past Medical History:  Diagnosis Date   Chlamydia    GERD (gastroesophageal reflux disease)    Gonorrhea 2022   Hyperlipidemia    Hypertension    Ovarian cyst    PTSD (post-traumatic stress disorder)    Trichimoniasis 2022    Past Surgical History:  Procedure Laterality Date   none     no surgical history    Allergies: Patient has no known allergies.  Medications: Prior to Admission medications   Medication Sig Start Date End Date Taking? Authorizing Provider  doxycycline (VIBRA-TABS) 100 MG tablet Take 1 tablet (100 mg total) by mouth 2 (two) times daily for 14 days. 04/03/21 04/17/21 Yes Adam Phenix, MD  metroNIDAZOLE (FLAGYL) 500 MG tablet Take 1 tablet (500 mg total) by mouth 2 (two) times daily for 6 days. 04/03/21 04/09/21 Yes Adam Phenix, MD  predniSONE (DELTASONE) 10 MG tablet Day 1 take 6 tablets po qam. Day 2 take 5 tablets po qam. Day 3 take 4 tablets po qam. Day 4 take 3 tablets po qam. Day 5 take 2 tablets po qam. Day 6 take 1 tablet po qam. Patient not taking: Reported on 03/31/2021 03/08/21   Jacquelin Hawking, PA-C     Family History  Adopted: Yes  Problem Relation Age of Onset   Hypertension Mother    Heart attack Mother     Social History   Socioeconomic History   Marital status: Single     Spouse name: Not on file   Number of children: Not on file   Years of education: Not on file   Highest education level: Not on file  Occupational History   Not on file  Tobacco Use   Smoking status: Some Days    Packs/day: 0.50    Years: 15.00    Pack years: 7.50    Types: Cigarettes   Smokeless tobacco: Never  Vaping Use   Vaping Use: Every day  Substance and Sexual Activity   Alcohol use: Yes    Alcohol/week: 10.0 standard drinks    Types: 10 Cans of beer per week    Comment: drinks about 4-5 beers twice a week.    Drug use: Yes    Types: Marijuana, Methamphetamines    Comment: 2-3 blunts a week.  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  All other systems are negative.  Review of Systems  Constitutional:  Positive for chills and fever.  HENT:  Positive for dental problem.   Eyes: Negative.   Respiratory: Negative.    Cardiovascular: Negative.   Gastrointestinal:  Positive for abdominal pain and constipation.  Endocrine: Negative.   Genitourinary:  Positive for difficulty urinating.  Musculoskeletal: Negative.   Skin: Negative.   Allergic/Immunologic: Negative.   Neurological: Negative.   Hematological: Negative.   Psychiatric/Behavioral:  The patient is nervous/anxious.    Vital Signs: BP 127/82 (BP Location: Right Arm)   Pulse 72   Temp 98.2 F (36.8 C) (Oral)   Resp 18   Ht 5' (1.524 m)   Wt 160 lb (72.6 kg)   LMP 03/23/2021   SpO2 99%   BMI 31.25 kg/m   Physical Exam HENT:     Head: Normocephalic.  Eyes:      Extraocular Movements: Extraocular movements intact.  Cardiovascular:     Rate and Rhythm: Normal rate and regular rhythm.  Pulmonary:     Effort: Pulmonary effort is normal.     Breath sounds: Normal breath sounds.  Abdominal:     General: Abdomen is flat.     Palpations: Abdomen is soft.     Tenderness: There is abdominal tenderness in the right lower quadrant.  Skin:    General: Skin is dry.     Capillary Refill: Capillary refill takes 2 to 3 seconds.     Coloration: Skin is pale.  Neurological:     General: No focal deficit present.     Mental Status: She is alert.  Psychiatric:        Mood and Affect: Mood is anxious.    Imaging: US OB LESS THAN 14 WEEKS W/ OB TRANSVAGINAL AND DOPPLER  Result Date: 03/30/2021 CLINICAL DATA:  Pelvic pain EXAM: OBSTETRIC <14 WK [place] AND TRANSVAGINAL OB US DOPPLER ULTRASOUND OF OVARIES TECHNIQUE: Both transabdominal and transvaginal ultrasound examinations were performed for complete evaluation of the gestation as well as the maternal uterus, adnexal regions, and pelvic cul-de-sac. Transvaginal technique was performed to assess early pregnancy. Color and duplex Doppler ultrasound was utilized to evaluate blood flow to the ovaries. COMPARISON:  None. FINDINGS: Intrauterine gestational sac: None Yolk sac:  Not Visualized. Embryo:  Not Visualized. Cardiac Activity: Not Visualized. Subchorionic hemorrhage:  None visualized. Maternal uterus/adnexae: Normal bilateral ovaries. Moderate amount of pelvic free fluid. Pulsed Doppler evaluation of both ovaries demonstrates normal appearing low-resistance arterial and venous waveforms. IMPRESSION: 1. No intrauterine pregnancy is identified. Correlate with serial beta HCG. 2. Normal bilateral ovaries.  No ovarian torsion. Electronically Signed   By: Elige Ko M.D.   On: 03/30/2021 13:01    Labs:  CBC: Recent Labs    03/30/21 0921 03/31/21 0527 04/01/21 0512 04/02/21 0528  WBC 19.9* 18.3* 15.7* 13.1*  HGB  11.5* 11.3* 9.6* 9.7*  HCT 35.7* 35.1* 29.5* 29.9*  PLT 433* 406* 333 366    COAGS: Recent Labs    04/02/21 0653  INR 1.2    BMP: Recent Labs    04/03/20 1054 03/30/21 0921 03/31/21 0527  NA 135 133* 132*  K 3.9 4.1 3.3*  CL 101 101 97*  CO2 23 22 25   GLUCOSE 105* 132* 146*  BUN 14 7 7   CALCIUM 9.5 9.1 8.9  CREATININE 0.84 0.84 0.75  GFRNONAA >60 >60 >60    LIVER FUNCTION TESTS: Recent Labs    04/03/20 1054 03/30/21  0355 03/31/21 0527  BILITOT 0.5 0.5 0.5  AST 27 44* 24  ALT 20 39 30  ALKPHOS 47 136* 122  PROT 7.7 7.3 7.8  ALBUMIN 4.2 3.5 3.6    Assessment and Plan:  Right Tubo-ovarian Abscess --Proceeding with planned drainage --Pt NPO since 5:30am and amenable to sedation --Questions answered to patient's satisfaction --Pt eager for discharge.  Suspect at earliest, tomorrow.  Will defer to OBGYN for this.    Risks and benefits discussed with the patient including bleeding, infection, damage to adjacent structures, bowel perforation/fistula connection, and sepsis.  All of the patient's questions were answered, patient is agreeable to proceed. Consent signed and in chart.   Thank you for this interesting consult.  I greatly enjoyed meeting Gina Fletcher and look forward to participating in their care.  A copy of this report was sent to the requesting provider on this date.  Electronically Signed: Sheliah Plane, PA 04/02/2021, 10:33 AM   I spent a total of 40 Minutes in face to face in clinical consultation, greater than 50% of which was counseling/coordinating care for tubo-ovarian abscess.

## 2021-04-02 NOTE — Procedures (Signed)
Interventional Radiology Procedure:   Indications: TOA      Procedure: CT guided pelvic drain placement    Findings: Right transgluteal drain placed and 15 ml of bloody thick fluid was removed.  10 Fr drain in place.  Complications: No immediate complications noted.     EBL: Minimal  Plan: Routine flushing and follow output.     Hazelene Doten R. Lowella Dandy, MD  Pager: [phone]

## 2021-04-02 NOTE — Progress Notes (Signed)
Pt taken to IR for procedure. Carmelina Dane, RN

## 2021-04-02 NOTE — Progress Notes (Signed)
Gynecology Progress Note  Admission Date: 03/31/2021 Current Date: 04/02/2021 8:50 AM  Gina Fletcher is a 30 y.o. [id number] HD#3 admitted for pelvic abscess/TOA   History complicated by: Patient Active Problem List   Diagnosis Date Noted   Tubo-ovarian abscess 03/31/2021   TOA (tubo-ovarian abscess) 03/31/2021    ROS and patient/family/surgical history, located on admission H&P note dated 03/31/2021, have been reviewed, and there are no changes except as noted below Yesterday/Overnight Events:  none  Subjective:  Pt seen this morning.  She was very anxious about the drainage procedure and may have been unaware of NPO status.  She ate a bag of chips at 0530.  IR is aware and will move procedure to the afternoon.  Pt otherwise is doing well.  She denies fever/chills and pain has improved.  She denies nausea/vomiting.  Objective:   Vitals:   04/01/21 2020 04/01/21 2334 04/02/21 0419 04/02/21 0804  BP: (!) 152/97 128/84 120/71 127/82  Pulse: 92 85 69 72  Resp: 20 18 17 18   Temp: 98.9 F (37.2 C) 98.4 F (36.9 C) 98.4 F (36.9 C) 98.2 F (36.8 C)  TempSrc: Oral Oral Oral Oral  SpO2: 93% 98% 97% 99%  Weight:      Height:        Temp:  [97.8 F (36.6 C)-98.9 F (37.2 C)] 98.2 F (36.8 C) (12/02 0804) Pulse Rate:  [69-96] 72 (12/02 0804) Resp:  [16-20] 18 (12/02 0804) BP: (111-152)/(71-97) 127/82 (12/02 0804) SpO2:  [93 %-100 %] 99 % (12/02 0804) I/O last 3 completed shifts: In: 680 [P.O.:680] Out: 1550 [Urine:1550] No intake/output data recorded.  Intake/Output Summary (Last 24 hours) at 04/02/2021 0850 Last data filed at 04/01/2021 2331 Gross per 24 hour  Intake 240 ml  Output 1050 ml  Net -810 ml     Current Vital Signs 24h Vital Sign Ranges  T 98.2 F (36.8 C) Temp  Avg: 98.3 F (36.8 C)  Min: 97.8 F (36.6 C)  Max: 98.9 F (37.2 C)  BP 127/82 BP  Min: 111/75  Max: 152/97  HR 72 Pulse  Avg: 80.7  Min: 69  Max: 96  RR 18 Resp  Avg: 18.2  Min: 16  Max: 20   SaO2 99 %  (room air) SpO2  Avg: 97.7 %  Min: 93 %  Max: 100 %       24 Hour I/O Current Shift I/O  Time Ins Outs 12/01 0701 - 12/02 0700 In: 240 [P.O.:240] Out: 1050 [Urine:1050] No intake/output data recorded.   Patient Vitals for the past 12 hrs:  BP Temp Temp src Pulse Resp SpO2  04/02/21 0804 127/82 98.2 F (36.8 C) Oral 72 18 99 %  04/02/21 0419 120/71 98.4 F (36.9 C) Oral 69 17 97 %  04/01/21 2334 128/84 98.4 F (36.9 C) Oral 85 18 98 %     Patient Vitals for the past 24 hrs:  BP Temp Temp src Pulse Resp SpO2  04/02/21 0804 127/82 98.2 F (36.8 C) Oral 72 18 99 %  04/02/21 0419 120/71 98.4 F (36.9 C) Oral 69 17 97 %  04/01/21 2334 128/84 98.4 F (36.9 C) Oral 85 18 98 %  04/01/21 2020 (!) 152/97 98.9 F (37.2 C) Oral 92 20 93 %  04/01/21 1617 (!) 147/95 98.3 F (36.8 C) Oral 96 20 99 %  04/01/21 1208 111/75 97.8 F (36.6 C) Oral 70 16 100 %    Physical exam: General appearance: alert, cooperative, appears stated  age, and no distress Abdomen:  nondistended, soft,  diffuse mild lower abdominal tenderness, midline and right was most tender GU: No gross VB Lungs:  deferred Heart: deferred Extremities: no lower extremity edema, no calf pain bilaterally Skin: WNL Psych: appropriate Neurologic: Grossly normal  Medications Current Facility-Administered Medications  Medication Dose Route Frequency Provider Last Rate Last Admin   cefoTEtan (CEFOTAN) 2 g in sodium chloride 0.9 % 100 mL IVPB  2 g Intravenous Q12H Adam Phenix, MD 200 mL/hr at 04/02/21 0808 2 g at 04/02/21 4268   diphenhydrAMINE (BENADRYL) capsule 25 mg  25 mg Oral QHS PRN [place] Bing, MD   25 mg at 04/01/21 2137   doxycycline (VIBRA-TABS) tablet 100 mg  100 mg Oral Q12H Warden Fillers, MD       ketorolac (TORADOL) 30 MG/ML injection 30 mg  30 mg Intravenous Q6H Adam Phenix, MD   30 mg at 04/02/21 3419   lactated ringers infusion   Intravenous Continuous [place] Bing, MD 75 mL/hr  at 04/02/21 0544 New Bag at 04/02/21 0544   morphine 4 MG/ML injection 4 mg  4 mg Intravenous Q2H PRN Gerhard Munch, MD   4 mg at 03/31/21 1424   ondansetron (ZOFRAN) tablet 4 mg  4 mg Oral Q6H PRN Adam Phenix, MD   4 mg at 04/01/21 1348   Or   ondansetron (ZOFRAN) injection 4 mg  4 mg Intravenous Q6H PRN Adam Phenix, MD       potassium chloride SA (KLOR-CON M) CR tablet 20 mEq  20 mEq Oral BID Warden Fillers, MD   20 mEq at 04/01/21 2115   traMADol (ULTRAM) tablet 50 mg  50 mg Oral Q6H PRN Adam Phenix, MD   50 mg at 04/01/21 0753      Labs  Recent Labs  Lab 03/31/21 0527 04/01/21 0512 04/02/21 0528  WBC 18.3* 15.7* 13.1*  HGB 11.3* 9.6* 9.7*  HCT 35.1* 29.5* 29.9*  PLT 406* 333 366    Recent Labs  Lab 03/30/21 0921 03/31/21 0527  NA 133* 132*  K 4.1 3.3*  CL 101 97*  CO2 22 25  BUN 7 7  CREATININE 0.84 0.75  CALCIUM 9.1 8.9  PROT 7.3 7.8  BILITOT 0.5 0.5  ALKPHOS 136* 122  ALT 39 30  AST 44* 24  GLUCOSE 132* 146*    Radiology N/a  Assessment & Plan:  PID/ TOA  Due to size of abscess, pt will get CT guided drainage today Since pain has improved, WBC trending down and pt is afebrile will anticipate d/c on 12/3 after procedure completed. Code Status: Full Code  Mariel Aloe, MD Attending Center for [facility])

## 2021-04-03 ENCOUNTER — Encounter (HOSPITAL_COMMUNITY): Payer: Self-pay | Admitting: Obstetrics & Gynecology

## 2021-04-03 DIAGNOSIS — N7093 Salpingitis and oophoritis, unspecified: Principal | ICD-10-CM

## 2021-04-03 LAB — CBC WITH DIFFERENTIAL/PLATELET
Abs Immature Granulocytes: 0.15 10*3/uL — ABNORMAL HIGH (ref 0.00–0.07)
Basophils Absolute: 0 10*3/uL (ref 0.0–0.1)
Basophils Relative: 0 %
Eosinophils Absolute: 0.1 10*3/uL (ref 0.0–0.5)
Eosinophils Relative: 1 %
HCT: 31.4 % — ABNORMAL LOW (ref 36.0–46.0)
Hemoglobin: 10.2 g/dL — ABNORMAL LOW (ref 12.0–15.0)
Immature Granulocytes: 1 %
Lymphocytes Relative: 18 %
Lymphs Abs: 1.9 10*3/uL (ref 0.7–4.0)
MCH: 31 pg (ref 26.0–34.0)
MCHC: 32.5 g/dL (ref 30.0–36.0)
MCV: 95.4 fL (ref 80.0–100.0)
Monocytes Absolute: 0.5 10*3/uL (ref 0.1–1.0)
Monocytes Relative: 5 %
Neutro Abs: 7.8 10*3/uL — ABNORMAL HIGH (ref 1.7–7.7)
Neutrophils Relative %: 75 %
Platelets: 434 10*3/uL — ABNORMAL HIGH (ref 150–400)
RBC: 3.29 MIL/uL — ABNORMAL LOW (ref 3.87–5.11)
RDW: 12 % (ref 11.5–15.5)
WBC: 10.5 10*3/uL (ref 4.0–10.5)
nRBC: 0 % (ref 0.0–0.2)

## 2021-04-03 NOTE — Progress Notes (Incomplete)
Pt refuse lab draw DR EURIE aware  and at bedside

## 2021-04-03 NOTE — Progress Notes (Signed)
HD#4 8 cm TOA, right, S/P IR drainage  Subjective: Patient reports feels so much better.  No N/V Pain is minimal   Objective: I have reviewed patient's vital signs, intake and output, medications, labs, microbiology, pathology, and radiology results.  General: alert, cooperative, and no distress GI: soft, non-tender; bowel sounds normal; no masses,  no organomegaly   Assessment/Plan: Continue IV antibiotics for 24 more hours, less than 1 day post IR drainage Plan for discharge tomorrow on oral antibiotics for 2 wweks, OP follow up with IR providers  LOS: 3 days    Lazaro Arms 04/03/2021, 7:55 AM

## 2021-04-04 ENCOUNTER — Telehealth (HOSPITAL_COMMUNITY): Payer: Self-pay | Admitting: Physician Assistant

## 2021-04-04 ENCOUNTER — Other Ambulatory Visit (HOSPITAL_COMMUNITY): Payer: Self-pay | Admitting: Physician Assistant

## 2021-04-04 DIAGNOSIS — N7093 Salpingitis and oophoritis, unspecified: Secondary | ICD-10-CM

## 2021-04-04 MED ORDER — SALINE FLUSH 0.9 % IV SOLN
5.0000 mL | Freq: Two times a day (BID) | INTRAVENOUS | 0 refills | Status: AC
  Filled 2021-04-04 – 2021-04-08 (×2): qty 200, 20d supply, fill #0
  Filled 2021-04-08: qty 200, 10d supply, fill #0

## 2021-04-04 MED ORDER — TRAMADOL HCL 50 MG PO TABS: 50.0000 mg | ORAL_TABLET | Freq: Four times a day (QID) | ORAL | 0 refills | Status: AC | PRN

## 2021-04-04 NOTE — Discharge Summary (Signed)
Physician Discharge Summary  Patient ID: Gina Fletcher MRN: [id number] DOB/AGE: 32-Dec-1992 32 y.o.  Admit date: 03/31/2021 Discharge date: 04/04/2021  Admission Diagnoses:PID/ TOA  Discharge Diagnoses: PID/TOA Principal Problem:   Tubo-ovarian abscess Active Problems:   Gonorrhea   Trichomonal vaginitis   Discharged Condition: good  Hospital Course: Pt admitted after transfer from [facility]  with abdominal pain.  CT scan diagnosed pt with PID/TOA.  Cultures later showed patient was positive for gonorrhea.  Pt was started on routine PID treatment and clinically improved,; however, due to the size of the TOA pt was recommended to have CT guided drainage.  Pt underwent procedure on HD 3.  She continued to improve and was discharged home on 04/05/21 with oral antibitoics.  Consults:  Interventional radiology  Significant Diagnostic Studies: radiology: CT scan: abdomen and pelvis  Treatments: antibiotics: cefotetan and doxycycline and procedures: percutaneous drainage catheter placement  Discharge Exam: Blood pressure 119/75, pulse 77, temperature 97.8 F (36.6 C), temperature source Oral, resp. rate 18, height 5' (1.524 m), weight 72.6 kg, last menstrual period 03/23/2021, SpO2 98 %, unknown if currently breastfeeding. General appearance: alert, cooperative, and no distress Head: Normocephalic, without obvious abnormality, atraumatic Resp: clear to auscultation bilaterally Cardio: regular rate and rhythm GI: soft , nondistended, positive bowel sounds, mildly tender Extremities: extremities normal, atraumatic, no cyanosis or edema  Disposition: Discharge disposition: 01-Home or Self Care       Discharge Instructions     Call MD for:  difficulty breathing, headache or visual disturbances   Complete by: As directed    Call MD for:  persistant dizziness or light-headedness   Complete by: As directed    Call MD for:  persistant nausea and vomiting   Complete by: As  directed    Call MD for:  severe uncontrolled pain   Complete by: As directed    Call MD for:  temperature >100.4   Complete by: As directed    Diet general   Complete by: As directed    Discharge wound care:   Complete by: As directed    Keep drain site clean and dry   Driving Restrictions   Complete by: As directed    No driving if patient still experiences significant abdominal pain   Increase activity slowly   Complete by: As directed    Lifting restrictions   Complete by: As directed    As tolerated   Sexual Activity Restrictions   Complete by: As directed    Pelvic rest until seen for follow up, no unprotected sex until negative test of cure of patient and partner      Allergies as of 04/04/2021   No Known Allergies      Medication List     STOP taking these medications    predniSONE 10 MG tablet Commonly known as: DELTASONE       TAKE these medications    doxycycline 100 MG tablet Commonly known as: VIBRA-TABS Take 1 tablet (100 mg total) by mouth 2 (two) times daily for 14 days.   metroNIDAZOLE 500 MG tablet Commonly known as: Flagyl Take 1 tablet (500 mg total) by mouth 2 (two) times daily for 6 days.   traMADol 50 MG tablet Commonly known as: ULTRAM Take 1 tablet (50 mg total) by mouth every 6 (six) hours as needed for up to 5 days for moderate pain.               Discharge Care Instructions  (From  admission, onward)           Start     Ordered   04/04/21 0000  Discharge wound care:       Comments: Keep drain site clean and dry   04/04/21 0923            Follow-up Information     [facility] Follow up on 04/29/2021.   Specialty: Obstetrics and Gynecology Why: follow up from PID Contact information: [address] [place] [address] [phone]                Signed: Warden Fillers 04/04/2021, 9:27 AM

## 2021-04-04 NOTE — Discharge Instructions (Signed)
If you have not been contacted by radiology in 1 week call [phone] to schedule appointment for drain removal

## 2021-04-04 NOTE — Progress Notes (Signed)
RN discussed with patient about infectious disease testing ordered by the doctor. Patient said she would refuse blood draw in the morning for testing due to pain and bruising in her arms from multiple previous lab draws.   RN emphasized importance of testing for labs ordered including HIV, RPR, Hep B, and Hep C due to history of STIs and current infection. Patient said she would get tested when her arms healed.    Quincy Simmonds, RN

## 2021-04-04 NOTE — Progress Notes (Signed)
Pt and family instructed  on how to care for drain pt also called to notify radiology  for further instructions and follow up supplies given for dressing changes and flushing  partner feels confident  with instructions and patient   MEDS given to pt from pharmancy

## 2021-04-04 NOTE — Progress Notes (Signed)
Patient ID: Gina Fletcher, female   DOB: 1991/03/01, 30 y.o.   MRN: [id number]     Supervising Physician: Malachy Moan  Patient Status:  Discharged from [facility]  Chief Complaint:  TOA Abscess  Subjective:  Pt discharged prior to IR follow up.  She is now 2 days s/p placement of drain in TOA abscess.  Patient reports doing well and expresses confidence in caring for drain.      Allergies: Patient has no known allergies.  Medications: Prior to Admission medications   Medication Sig Start Date End Date Taking? Authorizing Provider  doxycycline (VIBRA-TABS) 100 MG tablet Take 1 tablet (100 mg total) by mouth 2 (two) times daily for 14 days. 04/03/21 04/17/21  Adam Phenix, MD  metroNIDAZOLE (FLAGYL) 500 MG tablet Take 1 tablet (500 mg total) by mouth 2 (two) times daily for 6 days. 04/03/21 04/09/21  Adam Phenix, MD  Sodium Chloride Flush (SALINE FLUSH) 0.9 % SOLN Inject 5 mLs into the vein 2 (two) times daily for 20 days. 04/04/21 04/24/21  Vernesha Talbot, Mayme Genta, PA  traMADol (ULTRAM) 50 MG tablet Take 1 tablet (50 mg total) by mouth every 6 (six) hours as needed for up to 5 days for moderate pain. 04/04/21 04/09/21  Warden Fillers, MD     Vital Signs: LMP 03/23/2021   Imaging: CT IMAGE GUIDED DRAINAGE BY PERCUTANEOUS CATHETER  Result Date: 04/02/2021 INDICATION: 30 year old with tubo-ovarian abscess. Complex pelvic fluid collection is amenable for transgluteal drainage. EXAM: CT-guided pelvic abscess drain placement MEDICATIONS: Moderate sedation ANESTHESIA/SEDATION: Moderate (conscious) sedation was employed during this procedure. A total of Versed 3.0mg  and fentanyl 150 mcg was administered intravenously at the order of the provider performing the procedure. Total intra-service moderate sedation time: 34 minutes. Patient's level of consciousness and vital signs were monitored continuously by radiology nurse throughout the procedure under the supervision of the provider  performing the procedure. COMPLICATIONS: None immediate. PROCEDURE: Informed written consent was obtained from the patient after a thorough discussion of the procedural risks, benefits and alternatives. All questions were addressed.A timeout was performed prior to the initiation of the procedure. Patient was placed prone. CT images through the pelvis were obtained. Pelvic fluid collection was identified. Right buttock was prepped and draped in sterile fashion. Maximal barrier sterile technique was utilized including caps, mask, sterile gowns, sterile gloves, sterile drape, hand hygiene and skin antiseptic. Skin was anesthetized with 1% lidocaine. A small incision was made. Using CT guidance, an 18 gauge trocar needle was directed into the pelvic fluid collection using a right transgluteal approach. Initially, amber colored fluid was aspirated. Superstiff Amplatz wire was advanced into the collection and placement was confirmed with CT. The tract was dilated over the wire to accommodate a 10 [place] multipurpose drain. 15 mL of thick bloody fluid was aspirated. Follow up CT images were obtained. Drain was flushed with saline and attached to a suction bulb. Catheter was sutured to skin and secured to skin with a StatLock device. Bandage was placed. FINDINGS: Patient has a low-density collection in the pelvis just anterior to the rectum. Needle was successfully placed within the collection using a right transgluteal approach. 15 mL of thick bloody fluid was aspirated following placement of the 10 French drain. Fluid was sent for culture. Drain extends anteriorly and tip is positioned near the left adnexa. IMPRESSION: CT-guided placement of a pelvic drain into the tubo-ovarian abscess using a right transgluteal approach. Electronically Signed   By: Richarda Overlie M.D.   On:  04/02/2021 14:10    Labs:  CBC: Recent Labs    03/31/21 0527 04/01/21 0512 04/02/21 0528 04/03/21 1538  WBC 18.3* 15.7* 13.1* 10.5  HGB 11.3*  9.6* 9.7* 10.2*  HCT 35.1* 29.5* 29.9* 31.4*  PLT 406* 333 366 434*    COAGS: Recent Labs    04/02/21 0653  INR 1.2    BMP: Recent Labs    03/30/21 0921 03/31/21 0527  NA 133* 132*  K 4.1 3.3*  CL 101 97*  CO2 22 25  GLUCOSE 132* 146*  BUN 7 7  CALCIUM 9.1 8.9  CREATININE 0.84 0.75  GFRNONAA >60 >60    LIVER FUNCTION TESTS: Recent Labs    03/30/21 0921 03/31/21 0527  BILITOT 0.5 0.5  AST 44* 24  ALT 39 30  ALKPHOS 136* 122  PROT 7.3 7.8  ALBUMIN 3.5 3.6    Assessment and Plan:  TOA --drain placed 04/02/21 by Lowella Dandy --WBC normalized --Drain care education provided --sent saline rx to pharmacy --Scheduler will call to arrange drain follow up to occur in 10-14 days from placement.   Electronically Signed: Sheliah Plane, PA 04/04/2021, 12:04 PM   I spent a total of 15 Minutes on phone with patient, greater than 50% of which was counseling/coordinating care for TOA.

## 2021-04-05 ENCOUNTER — Other Ambulatory Visit: Payer: Self-pay | Admitting: Obstetrics & Gynecology

## 2021-04-05 ENCOUNTER — Other Ambulatory Visit (HOSPITAL_COMMUNITY): Payer: Self-pay | Admitting: Physician Assistant

## 2021-04-05 ENCOUNTER — Other Ambulatory Visit (HOSPITAL_COMMUNITY): Payer: Self-pay

## 2021-04-05 DIAGNOSIS — N7093 Salpingitis and oophoritis, unspecified: Secondary | ICD-10-CM

## 2021-04-07 LAB — AEROBIC/ANAEROBIC CULTURE W GRAM STAIN (SURGICAL/DEEP WOUND)
Culture: NO GROWTH
Gram Stain: NONE SEEN

## 2021-04-08 ENCOUNTER — Other Ambulatory Visit (HOSPITAL_COMMUNITY): Payer: Self-pay

## 2021-04-08 MED ORDER — NORMAL SALINE FLUSH 0.9 % IV SOLN
5.0000 mL | Freq: Two times a day (BID) | INTRAVENOUS | 0 refills | Status: AC
  Filled 2021-04-08: qty 200, 10d supply, fill #0

## 2021-04-14 ENCOUNTER — Ambulatory Visit
Admission: RE | Admit: 2021-04-14 | Discharge: 2021-04-14 | Disposition: A | Discharge status: Home / Self Care | Payer: Self-pay | Source: Ambulatory Visit | Attending: Physician Assistant | Admitting: Physician Assistant

## 2021-04-14 ENCOUNTER — Encounter: Payer: Self-pay | Admitting: *Deleted

## 2021-04-14 DIAGNOSIS — N7093 Salpingitis and oophoritis, unspecified: Secondary | ICD-10-CM

## 2021-04-14 HISTORY — PX: IR RADIOLOGIST EVAL & MGMT: IMG5224

## 2021-04-14 MED ORDER — IOPAMIDOL (ISOVUE-300) INJECTION 61%
100.0000 mL | Freq: Once | INTRAVENOUS | Status: AC | PRN
  Administered 2021-04-14: 14:00:00 100 mL via INTRAVENOUS

## 2021-04-14 NOTE — Progress Notes (Addendum)
Referring Physician(s): Dr Otelia Limes  Chief Complaint: The patient is seen in follow up today s/p TOA drain placed in IR 04/02/21  History of present illness:  RLQ pain N/V and abd pain x 1 week Admitted with Tubo ovarian abscess 03/31/21 Patient has a low-density collection in the pelvis just anterior to the rectum  12/2 IR drain:  IMPRESSION: CT-guided placement of a pelvic drain into the tubo-ovarian abscess using a right transgluteal approach.  Pt denies pain although still occasional pelvic cramping Denies N/V Denies fever chills OP of drain is scant Almost none in 3 days Still on Doxycycline for 3 more days  To see Dr Despina Hidden 04/28/21  Scheduled today for Re CT and evaluation  Past Medical History:  Diagnosis Date   Chlamydia    GERD (gastroesophageal reflux disease)    Gonorrhea 2022   Hyperlipidemia    Hypertension    Ovarian cyst    PTSD (post-traumatic stress disorder)    Trichomonal vaginitis 2022    Past Surgical History:  Procedure Laterality Date   none     no surgical history    Allergies: Patient has no known allergies.  Medications: Prior to Admission medications   Medication Sig Start Date End Date Taking? Authorizing Provider  doxycycline (VIBRA-TABS) 100 MG tablet Take 1 tablet (100 mg total) by mouth 2 (two) times daily for 14 days. 04/03/21 04/17/21  Adam Phenix, MD  Sodium Chloride Flush (NORMAL SALINE FLUSH) 0.9 % SOLN Inject 5 mLs into the vein 2 (two) times daily to complete 20 days 04/08/21   Boisseau, Mayme Genta, PA  Sodium Chloride Flush (SALINE FLUSH) 0.9 % SOLN Inject 5 mLs into the vein 2 (two) times daily for 20 days. 04/04/21 04/24/21  Sheliah Plane, PA     Family History  Adopted: Yes  Problem Relation Age of Onset   Hypertension Mother    Heart attack Mother     Social History   Socioeconomic History   Marital status: Single    Spouse name: Not on file   Number of children: Not on file   Years of education: Not on  file   Highest education level: Not on file  Occupational History   Not on file  Tobacco Use   Smoking status: Some Days    Packs/day: 0.50    Years: 15.00    Pack years: 7.50    Types: Cigarettes   Smokeless tobacco: Never  Vaping Use   Vaping Use: Every day  Substance and Sexual Activity   Alcohol use: Yes    Alcohol/week: 10.0 standard drinks    Types: 10 Cans of beer per week    Comment: drinks about 4-5 beers twice a week.    Drug use: Yes    Types: Marijuana, Methamphetamines    Comment: 2-3 blunts a week.  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   Sexual activity: Yes    Birth control/protection: None  Other Topics Concern   Not on file  Social History Narrative   Not on file   Social Determinants of Health   Financial Resource Strain: Not on file  Food Insecurity: Not on file  Transportation Needs: Not on file  Physical Activity: Not on file  Stress: Not on file  Social Connections: Not on file     Vital Signs: LMP 03/23/2021   Physical Exam Skin:    Comments: Site is clean and dry NT no bleeding No infection  CT shows resolution of collection Ovarian cyst remains  Drain removed without complication per Dr Archer Asa Dressing placed     Imaging: No results found.  Labs:  CBC: Recent Labs    03/31/21 0527 04/01/21 0512 04/02/21 0528 04/03/21 1538  WBC 18.3* 15.7* 13.1* 10.5  HGB 11.3* 9.6* 9.7* 10.2*  HCT 35.1* 29.5* 29.9* 31.4*  PLT 406* 333 366 434*    COAGS: Recent Labs    04/02/21 0653  INR 1.2    BMP: Recent Labs    03/30/21 0921 03/31/21 0527  NA 133* 132*  K 4.1 3.3*  CL 101 97*  CO2 22 25  GLUCOSE 132* 146*  BUN 7 7  CALCIUM 9.1 8.9  CREATININE 0.84 0.75  GFRNONAA >60 >60    LIVER FUNCTION TESTS: Recent Labs    03/30/21 0921 03/31/21 0527  BILITOT 0.5 0.5  AST 44* 24  ALT 39 30  ALKPHOS 136* 122  PROT 7.3 7.8  ALBUMIN 3.5 3.6    Assessment:  TOA - Rt TG abscess drain placed in IR 12/2 Resolved per CT  today Drain removed per Dr Archer Asa Pt to follow up with Dr Despina Hidden 04/28/21 She has good understanding of plan   Signed: Robet Leu, PA-C 04/14/2021, 2:02 PM   Please refer to Dr. Archer Asa attestation of this note for management and plan.

## 2021-04-16 ENCOUNTER — Other Ambulatory Visit (HOSPITAL_COMMUNITY): Payer: Self-pay

## 2021-04-28 ENCOUNTER — Encounter: Payer: Self-pay | Admitting: Obstetrics & Gynecology

## 2021-06-14 ENCOUNTER — Encounter (HOSPITAL_COMMUNITY): Payer: Self-pay | Admitting: *Deleted

## 2021-06-14 ENCOUNTER — Emergency Department (HOSPITAL_COMMUNITY)
Admission: EM | Admit: 2021-06-14 | Discharge: 2021-06-14 | Disposition: A | Discharge status: Home / Self Care | Payer: Self-pay | Attending: Emergency Medicine | Admitting: Emergency Medicine

## 2021-06-14 DIAGNOSIS — R1114 Bilious vomiting: Secondary | ICD-10-CM | POA: Insufficient documentation

## 2021-06-14 DIAGNOSIS — I10 Essential (primary) hypertension: Secondary | ICD-10-CM | POA: Insufficient documentation

## 2021-06-14 LAB — HCG, QUANTITATIVE, PREGNANCY: hCG, Beta Chain, Quant, S: 1 m[IU]/mL (ref <5)

## 2021-06-14 LAB — BASIC METABOLIC PANEL
Anion gap: 11 (ref 5–15)
BUN: 17 mg/dL (ref 6–20)
CO2: 25 mmol/L (ref 22–32)
Calcium: 9.4 mg/dL (ref 8.9–10.3)
Chloride: 103 mmol/L (ref 98–111)
Creatinine, Ser: 0.72 mg/dL (ref 0.44–1.00)
GFR, Estimated: >60 mL/min (ref >60)
Glucose, Bld: 114 mg/dL — ABNORMAL HIGH (ref 70–99)
Potassium: 4.1 mmol/L (ref 3.5–5.1)
Sodium: 139 mmol/L (ref 135–145)

## 2021-06-14 LAB — CBC WITH DIFFERENTIAL/PLATELET
Abs Immature Granulocytes: 0.02 10*3/uL (ref 0.00–0.07)
Basophils Absolute: 0.1 10*3/uL (ref 0.0–0.1)
Basophils Relative: 1 %
Eosinophils Absolute: 0.1 10*3/uL (ref 0.0–0.5)
Eosinophils Relative: 1 %
HCT: 38.9 % (ref 36.0–46.0)
Hemoglobin: 12.6 g/dL (ref 12.0–15.0)
Immature Granulocytes: 0 %
Lymphocytes Relative: 22 %
Lymphs Abs: 1.6 10*3/uL (ref 0.7–4.0)
MCH: 29.7 pg (ref 26.0–34.0)
MCHC: 32.4 g/dL (ref 30.0–36.0)
MCV: 91.7 fL (ref 80.0–100.0)
Monocytes Absolute: 0.4 10*3/uL (ref 0.1–1.0)
Monocytes Relative: 5 %
Neutro Abs: 5.2 10*3/uL (ref 1.7–7.7)
Neutrophils Relative %: 71 %
Platelets: 367 10*3/uL (ref 150–400)
RBC: 4.24 MIL/uL (ref 3.87–5.11)
RDW: 13.7 % (ref 11.5–15.5)
WBC: 7.3 10*3/uL (ref 4.0–10.5)
nRBC: 0 % (ref 0.0–0.2)

## 2021-06-14 MED ORDER — SODIUM CHLORIDE 0.9 % IV BOLUS
1000.0000 mL | Freq: Once | INTRAVENOUS | Status: AC
  Administered 2021-06-14: 1000 mL via INTRAVENOUS

## 2021-06-14 MED ORDER — PROMETHAZINE HCL 25 MG PO TABS: 25.0000 mg | ORAL_TABLET | Freq: Four times a day (QID) | ORAL | 0 refills | Status: AC | PRN

## 2021-06-14 MED ORDER — ONDANSETRON HCL 4 MG/2ML IJ SOLN
4.0000 mg | Freq: Once | INTRAMUSCULAR | Status: AC
  Administered 2021-06-14: 4 mg via INTRAVENOUS
  Filled 2021-06-14: qty 2

## 2021-06-14 MED ORDER — SODIUM CHLORIDE 0.9 % IV SOLN
12.5000 mg | Freq: Four times a day (QID) | INTRAVENOUS | Status: DC | PRN
  Administered 2021-06-14: 12.5 mg via INTRAVENOUS
  Filled 2021-06-14: qty 0.5

## 2021-06-14 NOTE — Discharge Instructions (Signed)
Frequent small sips of clear fluids this evening.  Bland diet as tolerated later today or tomorrow.  Follow-up with your primary care provider for recheck or return to the emergency department for any new or worsening symptoms.

## 2021-06-14 NOTE — ED Triage Notes (Signed)
Nausea and vomiting onset today

## 2021-06-14 NOTE — ED Provider Notes (Signed)
[facility] EMERGENCY DEPARTMENT Provider Note   CSN: [id number] Arrival date & time: 06/14/21  1250     History  Chief Complaint  Patient presents with   Emesis    Gina Fletcher is a 31 y.o. female.   Emesis Associated symptoms: no abdominal pain, no chills, no cough, no diarrhea, no fever, no headaches, no myalgias and no sore throat        Gina Fletcher is a 31 y.o. female with past medical history significant for hypertension, PTSD and ovarian cyst who presents to the Emergency Department complaining of sudden onset of nausea and vomiting this morning at 7 AM.  She endorses multiple episodes of vomiting with persistent nausea unable to keep down any fluids or solid foods.  She denies any abdominal pain associated with her symptoms.  No diarrhea, fever, chills, cough or chest pain.  No recent illness or new medications.    Home Medications Prior to Admission medications   Medication Sig Start Date End Date Taking? Authorizing Provider  Sodium Chloride Flush (NORMAL SALINE FLUSH) 0.9 % SOLN Inject 5 mLs into the vein 2 (two) times daily to complete 20 days 04/08/21   Sheliah Plane, PA      Allergies    Patient has no known allergies.    Review of Systems   Review of Systems  Constitutional:  Positive for appetite change. Negative for chills and fever.  HENT:  Negative for congestion and sore throat.   Respiratory:  Negative for cough, chest tightness and shortness of breath.   Cardiovascular:  Negative for chest pain.  Gastrointestinal:  Positive for nausea and vomiting. Negative for abdominal pain and diarrhea.  Genitourinary:  Negative for dysuria and flank pain.  Musculoskeletal:  Negative for myalgias, neck pain and neck stiffness.  Skin:  Negative for rash.  Neurological:  Negative for dizziness, syncope, weakness, numbness and headaches.  All other systems reviewed and are negative.  Physical Exam Updated Vital Signs BP (!) 128/100    Pulse 69     Temp 97.7 F (36.5 C) (Oral)    Resp 20    LMP 05/20/2021    SpO2 99%  Physical Exam Vitals and nursing note reviewed.  Constitutional:      General: She is not in acute distress.    Appearance: Normal appearance.     Comments: Patient actively dry heaving during my exam  HENT:     Mouth/Throat:     Mouth: Mucous membranes are moist.  Eyes:     Conjunctiva/sclera: Conjunctivae normal.  Cardiovascular:     Rate and Rhythm: Normal rate and regular rhythm.     Pulses: Normal pulses.  Pulmonary:     Effort: Pulmonary effort is normal.  Abdominal:     General: There is no distension.     Palpations: Abdomen is soft.     Tenderness: There is no abdominal tenderness. There is no right CVA tenderness or left CVA tenderness.  Musculoskeletal:        General: Normal range of motion.  Skin:    General: Skin is warm.     Capillary Refill: Capillary refill takes less than 2 seconds.     Findings: No rash.  Neurological:     General: No focal deficit present.     Mental Status: She is alert.     Sensory: No sensory deficit.     Motor: No weakness.    ED Results / Procedures / Treatments   Labs (all labs  ordered are listed, but only abnormal results are displayed) Labs Reviewed  BASIC METABOLIC PANEL - Abnormal; Notable for the following components:      Result Value   Glucose, Bld 114 (*)    All other components within normal limits  CBC WITH DIFFERENTIAL/PLATELET  HCG, QUANTITATIVE, PREGNANCY  I-STAT BETA HCG BLOOD, ED ([facility], [facility], [facility] ONLY)    EKG None  Radiology No results found.  Procedures Procedures    Medications Ordered in ED Medications  sodium chloride 0.9 % bolus 1,000 mL (1,000 mLs Intravenous New Bag/Given 06/14/21 1403)  ondansetron (ZOFRAN) injection 4 mg (4 mg Intravenous Given 06/14/21 1403)    ED Course/ Medical Decision Making/ A&P                             Vitals:   06/14/21 1545 06/14/21 1600 06/14/21 1615 06/14/21 1630  BP:  121/88  115/81   Pulse: 63 63 (!) 58 61  Temp:      Resp:    18  SpO2: 100% 97% 100% 98%  TempSrc:         Medical Decision Making Patient here with sudden onset of nausea and vomiting this morning.  Reports multiple episodes of vomiting and unable to tolerate fluids.  Concerned that she may be dehydrated.  Denies any abdominal pain, dysuria fever, and diarrhea  Amount and/or Complexity of Data Reviewed Labs: ordered.    Details: Labs interpreted by me, essentially unremarkable.  No leukocytosis.  No electrolyte derangement.  Patient is not pregnant.  Risk Prescription drug management.  Benign abdominal exam.  Actively vomiting upon arrival. Labs reassuring.  I suspect this is viral process. She has received IV fluids and antiemetic here.  Reports feeling better on recheck.  We will try oral fluid challenge  On repeat exam, patient continues to have reassuring abdominal exam.  She has tolerated oral fluids without difficulty.  Reports her nausea is resolved and she is ready for discharge home.  Will prescribe short course of antiemetic.  Return precautions were discussed.         Final Clinical Impression(s) / ED Diagnoses Final diagnoses:  Bilious vomiting with nausea    Rx / DC Orders ED Discharge Orders     None         Pauline Aus, PA-C 06/14/21 1724    Gloris Manchester, MD 06/18/21 1339

## 2021-06-15 LAB — I-STAT BETA HCG BLOOD, ED (MC, WL, AP ONLY): I-stat hCG, quantitative: <5 m[IU]/mL (ref <5)

## 2021-07-15 ENCOUNTER — Encounter: Payer: Self-pay | Admitting: Women's Health

## 2022-04-20 ENCOUNTER — Emergency Department (HOSPITAL_COMMUNITY): Admission: EM | Admit: 2022-04-20 | Discharge: 2022-04-20 | Discharge status: Against Medical Advice | Payer: Self-pay

## 2022-04-20 NOTE — ED Notes (Signed)
No answer for triage.

## 2022-04-20 NOTE — ED Notes (Signed)
Pt called for triage, no answer x2 

## 2022-10-13 ENCOUNTER — Emergency Department (HOSPITAL_COMMUNITY)
Admission: EM | Admit: 2022-10-13 | Discharge: 2022-10-13 | Disposition: A | Discharge status: Home / Self Care | Payer: Self-pay | Attending: Emergency Medicine | Admitting: Emergency Medicine

## 2022-10-13 ENCOUNTER — Other Ambulatory Visit: Payer: Self-pay

## 2022-10-13 ENCOUNTER — Encounter (HOSPITAL_COMMUNITY): Payer: Self-pay | Admitting: *Deleted

## 2022-10-13 DIAGNOSIS — Z202 Contact with and (suspected) exposure to infections with a predominantly sexual mode of transmission: Secondary | ICD-10-CM | POA: Insufficient documentation

## 2022-10-13 LAB — URINALYSIS, ROUTINE W REFLEX MICROSCOPIC
Bilirubin Urine: NEGATIVE
Glucose, UA: NEGATIVE mg/dL
Hgb urine dipstick: NEGATIVE
Ketones, ur: NEGATIVE mg/dL
Leukocytes,Ua: NEGATIVE
Nitrite: NEGATIVE
Protein, ur: NEGATIVE mg/dL
Specific Gravity, Urine: 1.002 — ABNORMAL LOW (ref 1.005–1.030)
pH: 6 (ref 5.0–8.0)

## 2022-10-13 LAB — WET PREP, GENITAL
Sperm: NONE SEEN
Trich, Wet Prep: NONE SEEN
Yeast Wet Prep HPF POC: NONE SEEN

## 2022-10-13 LAB — PREGNANCY, URINE: Preg Test, Ur: NEGATIVE

## 2022-10-13 NOTE — ED Triage Notes (Signed)
Pt reports her sexual partner called to inform her that he had discoloration/bumps on his genital area. Pt denies vaginal discharge.

## 2022-10-13 NOTE — Discharge Instructions (Addendum)
There is no evidence of any sexually transmitted infection on my exam today. You have a normal appearance of both internal and external genitalia on my exam.  Please follow-up on your gonorrhea, chlamydia results in 24 to 48 hours and present back to the emergency department at health department for treatment as needed.  As we discussed you have some clue cells on her wet prep although without any symptoms I would not diagnose you with an acute bacterial vaginosis.  This is a normal bacteria living in the vagina which can sometimes overgrow. If you begin to have foul odor or vaginal irritation you may want to return for treatment of your bacterial vaginosis.  To reiterate this is not a sexually transmitted infection.

## 2022-10-13 NOTE — ED Provider Notes (Signed)
[place] EMERGENCY DEPARTMENT AT [facility] Provider Note   CSN: [id number] Arrival date & time: 10/13/22  1010     History  Chief Complaint  Patient presents with   Exposure to STD    Gina Fletcher is a 32 y.o. female with past medical history significant for previous tubo-ovarian abscess, gonorrhea, trichomonas diagnosis was considered for possible STI exposure.  She reports her sexual partner called to inform her that he had some discoloration or bumps on the genital area.  He had raise concern for possible HSV infection.  Patient denies previous history of genital herpes.  Patient denies any ulcers, painful lesions, vaginal discharge, pain with urination.  She denies any fever, chills, abdominal pain.  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   Exposure to STD       Home Medications Prior to Admission medications   Medication Sig Start Date End Date Taking? Authorizing Provider  promethazine (PHENERGAN) 25 MG tablet Take 1 tablet (25 mg total) by mouth every 6 (six) hours as needed for nausea or vomiting. May cause drowsiness 06/14/21   Triplett, Tammy, PA-C  Sodium Chloride Flush (NORMAL SALINE FLUSH) 0.9 % SOLN Inject 5 mLs into the vein 2 (two) times daily to complete 20 days 04/08/21   Sheliah Plane, PA      Allergies    Patient has no known allergies.    Review of Systems   Review of Systems  All other systems reviewed and are negative.   Physical Exam Updated Vital Signs BP (!) 165/99 (BP Location: Right Arm)   Pulse 85   Temp 98.3 F (36.8 C) (Oral)   Resp 16   Ht 5\' 2"  (1.575 m)   Wt 81.6 kg   LMP 10/06/2022   SpO2 100%   BMI 32.92 kg/m  Physical Exam Vitals and nursing note reviewed.  Constitutional:      General: She is not in acute distress.    Appearance: Normal appearance.  HENT:     Head: Normocephalic and atraumatic.  Eyes:     General:        Right eye: No  discharge.        Left eye: No discharge.  Cardiovascular:     Rate and Rhythm: Normal rate and regular rhythm.     Heart sounds: No murmur heard.    No friction rub. No gallop.  Pulmonary:     Effort: Pulmonary effort is normal.     Breath sounds: Normal breath sounds.  Abdominal:     General: Bowel sounds are normal.     Palpations: Abdomen is soft.  Genitourinary:    Comments: Normal appearance of external vulva, intravaginal exam without excessive discharge, no significant ttp of cervix on bimanual exam Skin:    General: Skin is warm and dry.     Capillary Refill: Capillary refill takes less than 2 seconds.  Neurological:     Mental Status: She is alert and oriented to person, place, and time.  Psychiatric:        Mood and Affect: Mood normal.        Behavior: Behavior normal.     ED Results / Procedures / Treatments   Labs (all labs ordered are listed, but only abnormal results are displayed) Labs Reviewed  WET PREP, GENITAL - Abnormal; Notable for the following components:      Result Value   Clue Cells Wet Prep  HPF POC PRESENT (*)    WBC, Wet Prep HPF POC RARE (*)    All other components within normal limits  URINALYSIS, ROUTINE W REFLEX MICROSCOPIC - Abnormal; Notable for the following components:   Color, Urine COLORLESS (*)    Specific Gravity, Urine 1.002 (*)    All other components within normal limits  PREGNANCY, URINE  GC/CHLAMYDIA PROBE AMP ([place]) NOT AT [facility]    EKG None  Radiology No results found.  Procedures Procedures    Medications Ordered in ED Medications - No data to display  ED Course/ Medical Decision Making/ A&P                             Medical Decision Making Amount and/or Complexity of Data Reviewed Labs: ordered.   This patient is a 32 y.o. female  who presents to the ED for concern of need for STI check.   Differential diagnoses prior to evaluation: The emergent differential diagnosis includes, but is not  limited to,  gc/chlamydia, trichomoniasis, HSV, HIV, syphilis, vs other . This is not an exhaustive differential.   Past Medical History / Co-morbidities / Social History: Previous TOA, gonorrhea, trichomoniasis  Additional history: Chart reviewed. Pertinent results include: overall noncontributory  Physical Exam: Physical exam performed. The pertinent findings include: Normal appearance of external vulva, intravaginal exam without excessive discharge, no significant ttp of cervix on bimanual exam  BP elevated 165/99, patient endorses baseline hx of HTN  Lab Tests/Imaging studies: I personally interpreted labs/imaging and the pertinent results include: Wet prep unremarkable other than clue cells noted.  Patient without any foul odor, or vaginal irritation, and so after discussion patient with an asymptomatic over proliferation of Gardnerella, would not recommend treatment for acute bacterial vaginosis at this time.  Urinalysis unremarkable.  GC chlamydia pending, patient will follow-up in 24 to 48 hours.   Disposition: After consideration of the diagnostic results and the patients response to treatment, I feel that reassured patient that there is no evidence of acute surgical transmitted infection today, if her partner has concern for possible herpetic lesions that he should present for medical evaluation himself.  emergency department workup does not suggest an emergent condition requiring admission or immediate intervention beyond what has been performed at this time. The plan is: as above. The patient is safe for discharge and has been instructed to return immediately for worsening symptoms, change in symptoms or any other concerns.  Final Clinical Impression(s) / ED Diagnoses Final diagnoses:  Possible exposure to STD    Rx / DC Orders ED Discharge Orders     None         [place] 10/13/22 1149    Pricilla Loveless, MD 10/18/22 [phone]

## 2022-10-14 LAB — GC/CHLAMYDIA PROBE AMP (~~LOC~~) NOT AT ARMC
Chlamydia: NEGATIVE
Comment: NEGATIVE
Comment: NORMAL
Neisseria Gonorrhea: NEGATIVE

## 2023-06-14 ENCOUNTER — Encounter (HOSPITAL_COMMUNITY): Payer: Self-pay

## 2023-06-14 ENCOUNTER — Emergency Department (HOSPITAL_COMMUNITY)
Admission: EM | Admit: 2023-06-14 | Discharge: 2023-06-14 | Disposition: A | Discharge status: Home / Self Care | Payer: Medicaid Other

## 2023-06-14 ENCOUNTER — Other Ambulatory Visit: Payer: Self-pay

## 2023-06-14 ENCOUNTER — Emergency Department (HOSPITAL_COMMUNITY): Payer: Medicaid Other

## 2023-06-14 DIAGNOSIS — Z79899 Other long term (current) drug therapy: Secondary | ICD-10-CM | POA: Insufficient documentation

## 2023-06-14 DIAGNOSIS — I10 Essential (primary) hypertension: Secondary | ICD-10-CM | POA: Diagnosis not present

## 2023-06-14 DIAGNOSIS — R079 Chest pain, unspecified: Secondary | ICD-10-CM | POA: Diagnosis not present

## 2023-06-14 DIAGNOSIS — F17290 Nicotine dependence, other tobacco product, uncomplicated: Secondary | ICD-10-CM | POA: Diagnosis not present

## 2023-06-14 LAB — BASIC METABOLIC PANEL
Anion gap: 11 (ref 5–15)
BUN: 14 mg/dL (ref 6–20)
CO2: 24 mmol/L (ref 22–32)
Calcium: 9.8 mg/dL (ref 8.9–10.3)
Chloride: 99 mmol/L (ref 98–111)
Creatinine, Ser: 0.74 mg/dL (ref 0.44–1.00)
GFR, Estimated: >60 mL/min (ref >60)
Glucose, Bld: 108 mg/dL — ABNORMAL HIGH (ref 70–99)
Potassium: 3.7 mmol/L (ref 3.5–5.1)
Sodium: 134 mmol/L — ABNORMAL LOW (ref 135–145)

## 2023-06-14 LAB — CBC WITH DIFFERENTIAL/PLATELET
Abs Immature Granulocytes: 0.03 10*3/uL (ref 0.00–0.07)
Basophils Absolute: 0.1 10*3/uL (ref 0.0–0.1)
Basophils Relative: 1 %
Eosinophils Absolute: 0.1 10*3/uL (ref 0.0–0.5)
Eosinophils Relative: 1 %
HCT: 41.9 % (ref 36.0–46.0)
Hemoglobin: 14 g/dL (ref 12.0–15.0)
Immature Granulocytes: 0 %
Lymphocytes Relative: 22 %
Lymphs Abs: 2 10*3/uL (ref 0.7–4.0)
MCH: 31 pg (ref 26.0–34.0)
MCHC: 33.4 g/dL (ref 30.0–36.0)
MCV: 92.9 fL (ref 80.0–100.0)
Monocytes Absolute: 0.5 10*3/uL (ref 0.1–1.0)
Monocytes Relative: 6 %
Neutro Abs: 6.4 10*3/uL (ref 1.7–7.7)
Neutrophils Relative %: 70 %
Platelets: 345 10*3/uL (ref 150–400)
RBC: 4.51 MIL/uL (ref 3.87–5.11)
RDW: 12.5 % (ref 11.5–15.5)
WBC: 9.1 10*3/uL (ref 4.0–10.5)
nRBC: 0 % (ref 0.0–0.2)

## 2023-06-14 LAB — TROPONIN I (HIGH SENSITIVITY)
Troponin I (High Sensitivity): <2 ng/L (ref <18)
Troponin I (High Sensitivity): <2 ng/L (ref <18)

## 2023-06-14 LAB — PREGNANCY, URINE: Preg Test, Ur: NEGATIVE

## 2023-06-14 MED ORDER — LORAZEPAM 0.5 MG PO TABS
0.5000 mg | ORAL_TABLET | Freq: Once | ORAL | Status: AC
  Administered 2023-06-14: 0.5 mg via ORAL
  Filled 2023-06-14: qty 1

## 2023-06-14 MED ORDER — AMLODIPINE BESYLATE 5 MG PO TABS: 5.0000 mg | ORAL_TABLET | Freq: Every day | ORAL | 0 refills | Status: AC

## 2023-06-14 MED ORDER — AMLODIPINE BESYLATE 5 MG PO TABS
5.0000 mg | ORAL_TABLET | Freq: Once | ORAL | Status: DC
  Filled 2023-06-14: qty 1

## 2023-06-14 NOTE — ED Provider Notes (Signed)
[place] EMERGENCY DEPARTMENT AT [facility] Provider Note   CSN: [id number] Arrival date & time: 06/14/23  0900     History  Chief Complaint  Patient presents with   Hypertension    Gina Fletcher is a 33 y.o. female complains of chest pain hypertension.  Patient states that she has been monitoring her blood pressure the past few weeks and has been hanging around in the 150s and 160s.  Patient woke up with 159/101 and was concerned and came to the ED.  Patient does describe a chest tightness but states this feels very similar to her anxiety.  Patient denies shortness of breath, leg swelling, cardiopulmonary history.  Patient is currently not on any anti-hypertensives.  Patient does endorse vaping.  Patient denies headache or vision changes.  Home Medications Prior to Admission medications   Medication Sig Start Date End Date Taking? Authorizing Provider  amLODipine (NORVASC) 5 MG tablet Take 1 tablet (5 mg total) by mouth daily. 06/14/23 07/14/23 Yes Ma Munoz, Beverly Gust, PA-C  promethazine (PHENERGAN) 25 MG tablet Take 1 tablet (25 mg total) by mouth every 6 (six) hours as needed for nausea or vomiting. May cause drowsiness 06/14/21   Triplett, Tammy, PA-C  Sodium Chloride Flush (NORMAL SALINE FLUSH) 0.9 % SOLN Inject 5 mLs into the vein 2 (two) times daily to complete 20 days 04/08/21   Sheliah Plane, PA      Allergies    Patient has no known allergies.    Review of Systems   Review of Systems  Physical Exam Updated Vital Signs BP (!) 159/98 (BP Location: Right Arm)   Pulse 78   Temp 97.6 F (36.4 C) (Oral)   Resp 18   Ht 5\' 2"  (1.575 m)   Wt 82 kg   LMP 06/07/2023 (Exact Date)   SpO2 100%   BMI 33.06 kg/m  Physical Exam Vitals reviewed.  Constitutional:      General: She is not in acute distress. HENT:     Head: Normocephalic and atraumatic.  Eyes:     Extraocular Movements: Extraocular movements intact.     Conjunctiva/sclera: Conjunctivae normal.      Pupils: Pupils are equal, round, and reactive to light.  Cardiovascular:     Rate and Rhythm: Normal rate and regular rhythm.     Pulses: Normal pulses.     Heart sounds: Normal heart sounds.     Comments: 2+ bilateral radial/dorsalis pedis pulses with regular rate Pulmonary:     Effort: Pulmonary effort is normal. No respiratory distress.     Breath sounds: Normal breath sounds.  Abdominal:     Palpations: Abdomen is soft.     Tenderness: There is no abdominal tenderness. There is no guarding or rebound.  Musculoskeletal:        General: Normal range of motion.     Cervical back: Normal range of motion and neck supple.     Comments: 5 out of 5 bilateral grip/leg extension strength  Skin:    General: Skin is warm and dry.     Capillary Refill: Capillary refill takes less than 2 seconds.  Neurological:     General: No focal deficit present.     Mental Status: She is alert and oriented to person, place, and time.     Comments: Sensation intact in all 4 limbs  Psychiatric:        Mood and Affect: Mood normal.     ED Results / Procedures / Treatments  Labs (all labs ordered are listed, but only abnormal results are displayed) Labs Reviewed  BASIC METABOLIC PANEL - Abnormal; Notable for the following components:      Result Value   Sodium 134 (*)    Glucose, Bld 108 (*)    All other components within normal limits  CBC WITH DIFFERENTIAL/PLATELET  PREGNANCY, URINE  TROPONIN I (HIGH SENSITIVITY)  TROPONIN I (HIGH SENSITIVITY)    EKG None  Radiology DG Chest 2 View Result Date: 06/14/2023 CLINICAL DATA:  Chest pain EXAM: CHEST - 2 VIEW COMPARISON:  09/13/2008 FINDINGS: The heart size and mediastinal contours are within normal limits. Both lungs are clear. The visualized skeletal structures are unremarkable. IMPRESSION: No active cardiopulmonary disease. Electronically Signed   By: Judie Petit.  Shick M.D.   On: 06/14/2023 10:08    Procedures Procedures    Medications Ordered in  ED Medications  amLODipine (NORVASC) tablet 5 mg (has no administration in time range)  LORazepam (ATIVAN) tablet 0.5 mg (0.5 mg Oral Given 06/14/23 1129)    ED Course/ Medical Decision Making/ A&P                                 Medical Decision Making Amount and/or Complexity of Data Reviewed Labs: ordered. Radiology: ordered.  Risk Prescription drug management.   Gina Fletcher 33 y.o. presented today for elevated HTN. Working DDx that I considered at this time includes, but not limited to, HTN urgency/emergency, intracranial hemorrhage, acute renal artery stenosis, acute kidney injury, ACS, ophthalmologic emergencies.  R/o DDx: HTN urgency/emergency, intracranial hemorrhage, acute renal artery stenosis, acute kidney injury, ACS, ophthalmologic emergencies: These are considered less likely due to history of present illness, physical exam, labs/imaging findings  Review of prior external notes: 03/31/2021 outpatient visit  Unique Tests and My Independent Interpretation:  CBC: Unremarkable BMP: Unremarkable Troponin: Less than 2, less than 2 EKG: Rate, rhythm, axis, intervals all examined and without medically relevant abnormality. ST segments without concerns for elevations CXR: No acute findings Urine pregnancy: negative  Social Determinants of Health: none  Discussion with Independent Historian: None  Discussion of Management of Tests: None  Risk: Medium: prescription drug management  Risk Stratification Score: None  Plan: On exam patient was in no acute distress with stable vitals. Physical exam showed unremarkable. The cardiac monitor was ordered secondary to the patient's history of HTN and to monitor the patient for dysrhythmia. Cardiac monitor by my independent interpretation showed normal sinus.  Patient was evaluated by this provider in triage and states that she feels much better after the Ativan and thinks that the chest tightness was from her anxiety.   Patient does have reassuring workup including negative delta troponin so do feel that this is consistent with her anxiety.  Currently awaiting urine pregnancy however feel she can be discharged after this and follow-up with her primary care provider.  Urine pregnancy negative.  Labs and imaging do not show any signs of endorgan damage.  Patient is also not endorsing chest pain, shortness of breath, AMS, blurred vision or have AKI or neuro deficit.  At this time do believe patient is having asymptomatic hypertension and will need to follow-up with their primary care provider to discuss their blood pressure medications.  Do not want to reduce blood pressure here in the ED as this may cause a stroke which was discussed with the patient.  However will start patient on amlodipine and have her  follow-up with her primary care provider and prescribe this as well.  Discussed smoking cessation with patient and was they were offerred resources to help stop.  Total time was 5 min CPT code 78469.   Patient was given return precautions. Patient stable for discharge at this time.  Patient verbalized understanding of plan.  This chart was dictated using voice recognition software.  Despite best efforts to proofread,  errors can occur which can change the documentation meaning.         Final Clinical Impression(s) / ED Diagnoses Final diagnoses:  Hypertension, unspecified type    Rx / DC Orders ED Discharge Orders          Ordered    amLODipine (NORVASC) 5 MG tablet  Daily        06/14/23 1341              Netta Corrigan, PA-C 06/14/23 1402    Coral Spikes, DO 06/14/23 1454

## 2023-06-14 NOTE — ED Triage Notes (Signed)
Pt arrived via POV c/o hypertension at home. Pt presents anxious in Triage and endorses mild chest pain.

## 2023-06-14 NOTE — ED Provider Triage Note (Signed)
Emergency Medicine Provider Triage Evaluation Note  Gina Fletcher , a 33 y.o. female  was evaluated in triage.  Pt complains of chest pain hypertension.  Patient states that she has been monitoring her blood pressure the past few weeks and has been hanging around in the 150s and 160s.  Patient woke up with 159/101 and was concerned and came to the ED.  Patient does describe a chest tightness but states this feels very similar to her anxiety.  Patient denies shortness of breath, leg swelling, cardiopulmonary history.  Patient is currently not on any anti-hypertensives.  Review of Systems  Positive:  Negative:   Physical Exam  BP (!) 159/98 (BP Location: Right Arm)   Pulse 78   Temp 97.6 F (36.4 C) (Oral)   Resp 18   Ht 5\' 2"  (1.575 m)   Wt 82 kg   LMP 06/07/2023 (Exact Date)   SpO2 100%   BMI 33.06 kg/m  Gen:   Awake, no distress, anxious Resp:  Normal effort  MSK:   Moves extremities without difficulty  Other:    Medical Decision Making  Medically screening exam initiated at 10:19 AM.  Appropriate orders placed.  Gina Fletcher was informed that the remainder of the evaluation will be completed by another provider, this initial triage assessment does not replace that evaluation, and the importance of remaining in the ED until their evaluation is complete.  Workup initiated, patient's blood pressure here is 159/98 and sounds like this is chronic.  Patient given Ativan for her anxiety however will obtain labs and do suspect discharge with antihypertensives with primary care follow-up.  Patient stable at this time.   Netta Corrigan, PA-C 06/14/23 1020

## 2023-06-14 NOTE — Discharge Instructions (Addendum)
Please follow-up with your primary care provider the 1 have attached your for you today regards [facility] ER visit.  Today your labs and imaging are reassuring and most likely of uncontrolled hypertension otherwise known as high blood pressure.  We have started you on blood pressure meds however you will need to follow-up with a primary care provider for long-term management.  If symptoms change or worsen please return to the ER.

## 2023-07-01 IMAGING — CT CT IMAGE GUIDED DRAINAGE BY PERCUTANEOUS CATHETER
2 of 3 series · 12 of 32 positions shown, 18 images · non-contrast
Comparison: none

INDICATION: 30-year-old with tubo-ovarian abscess. Complex pelvic fluid
collection is amenable for transgluteal drainage.

[Series 2: i-spiral 5.0 b40f · axial · 0.59mm/px · z∈[+821,+912]mm · 9 of 34 slices shown, 15 images (1 of 2)]
[im 4/34  soft-tissue]
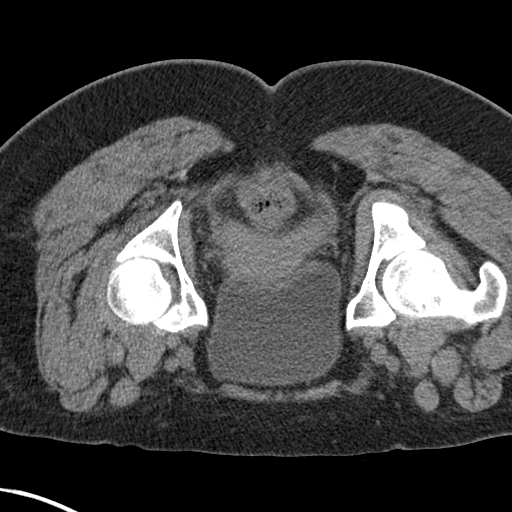
[im 4/34  bone]
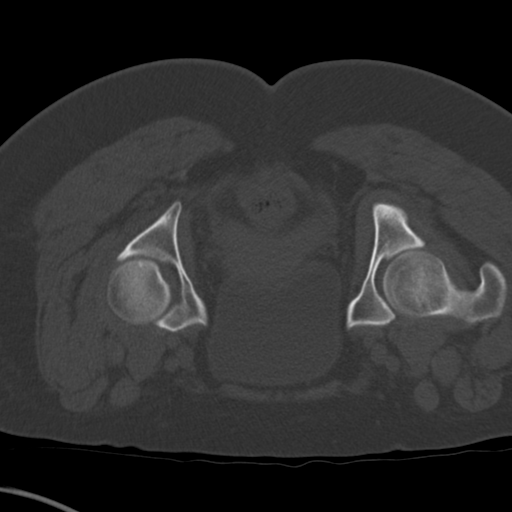
[im 7/34  soft-tissue]
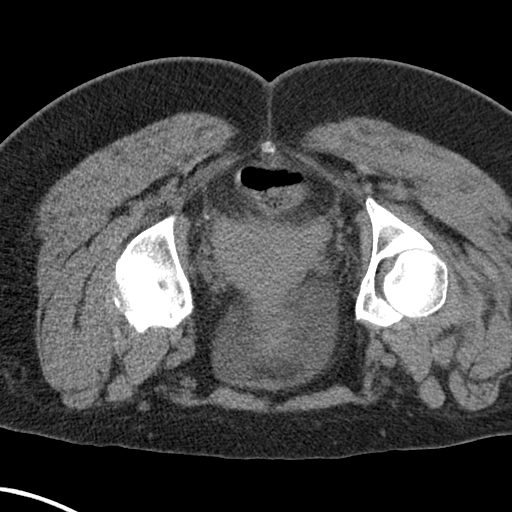
[im 10/34  soft-tissue]
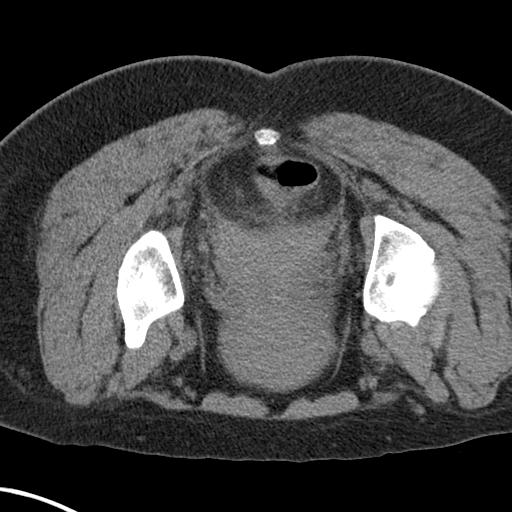
[im 14/34  soft-tissue]
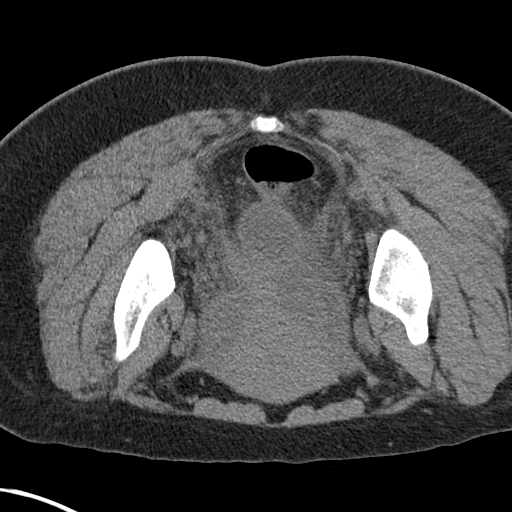
[im 17/34  soft-tissue]
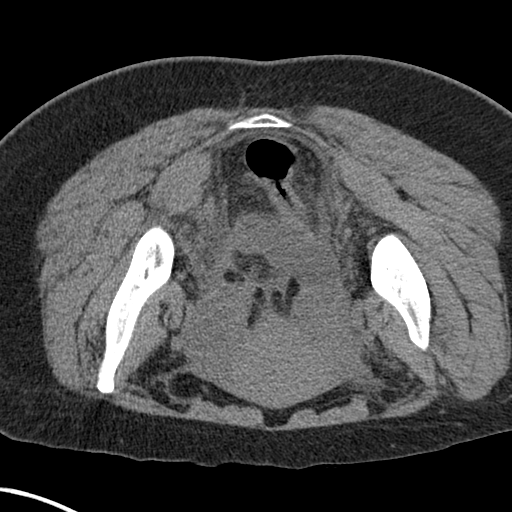
[im 20/34  soft-tissue]
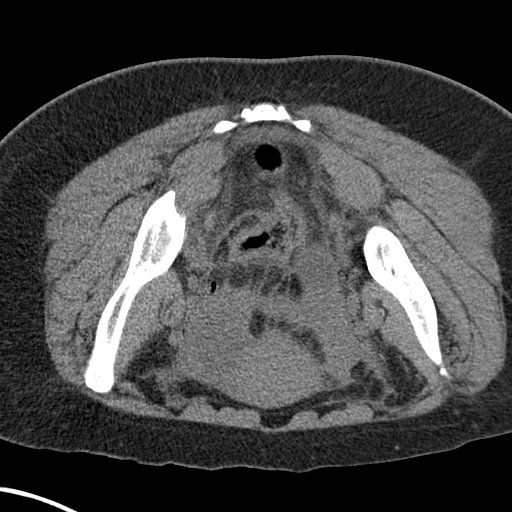
[im 20/34  lung]
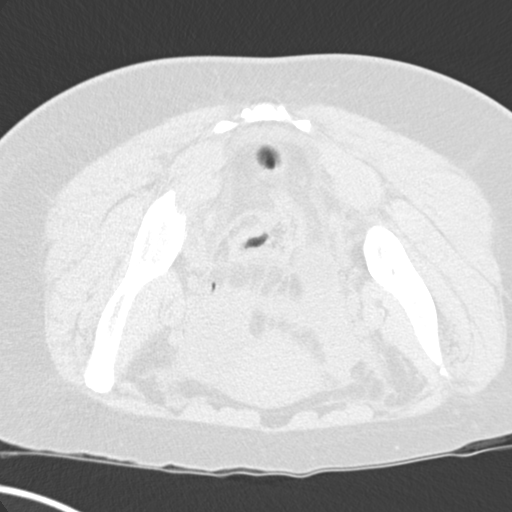
[im 24/34  soft-tissue]
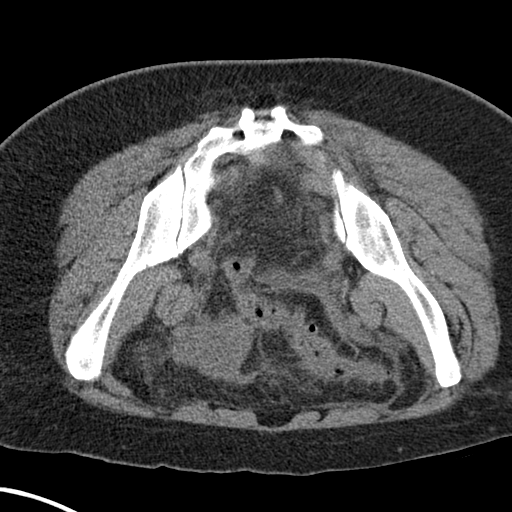
[im 24/34  lung]
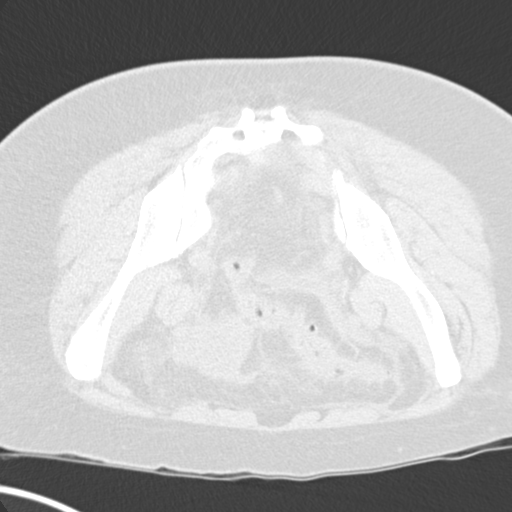
[im 27/34  soft-tissue]
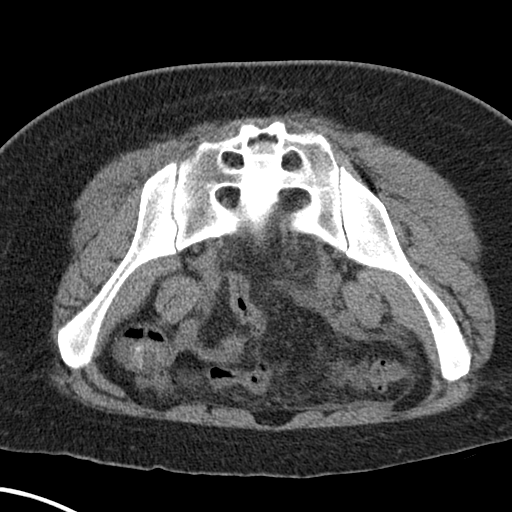
[im 27/34  lung]
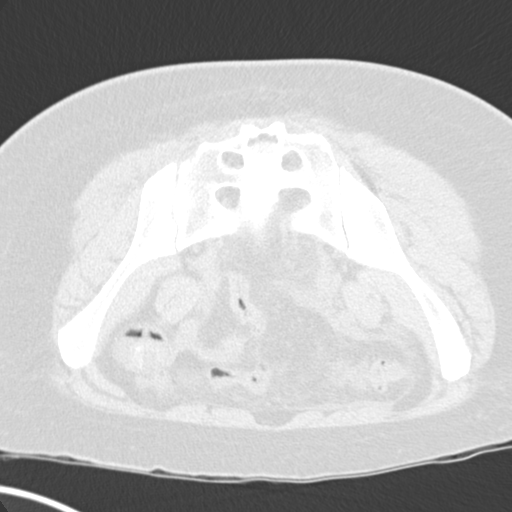
[im 30/34  soft-tissue]
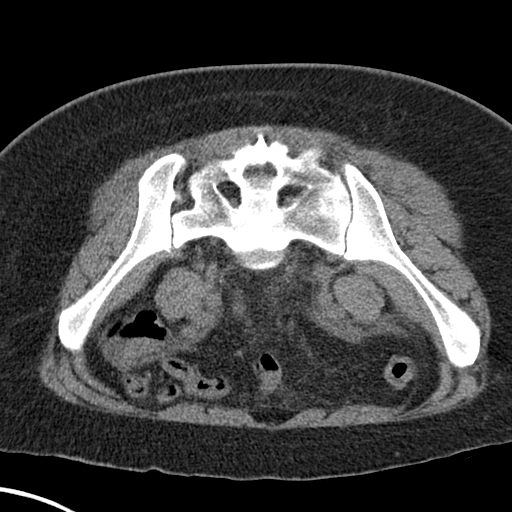
[im 30/34  lung]
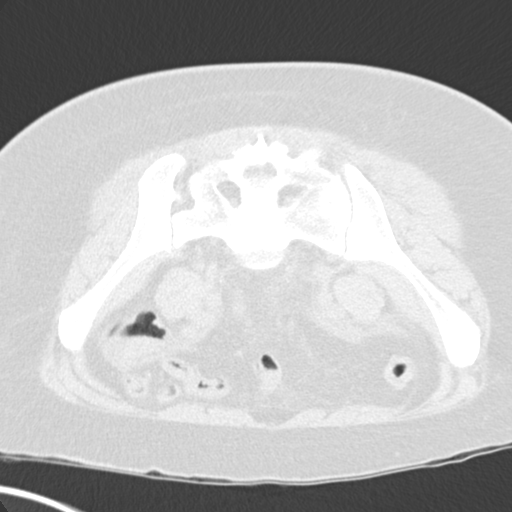
[im 30/34  bone]
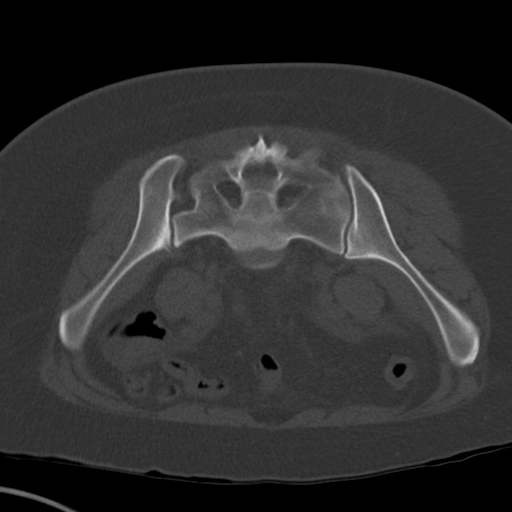

[Series 4: i-spiral 5.0 b40f · axial · 0.59mm/px · z∈[+821,+842]mm · 3 of 34 slices shown (2 of 2)]
[im 4/34  soft-tissue]
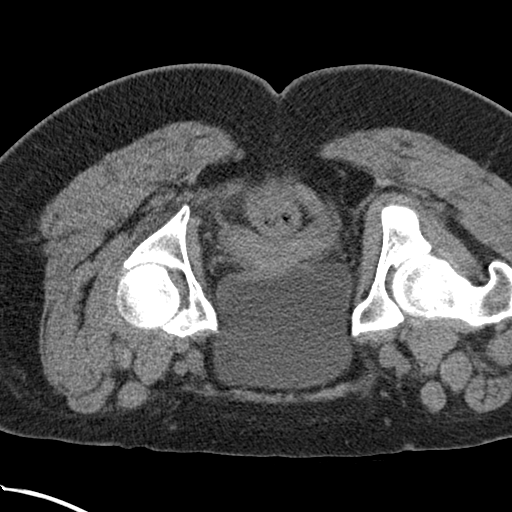
[im 7/34  soft-tissue]
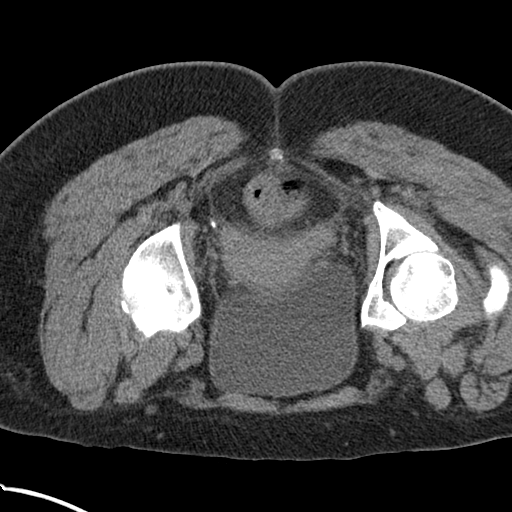
[im 10/34  soft-tissue]
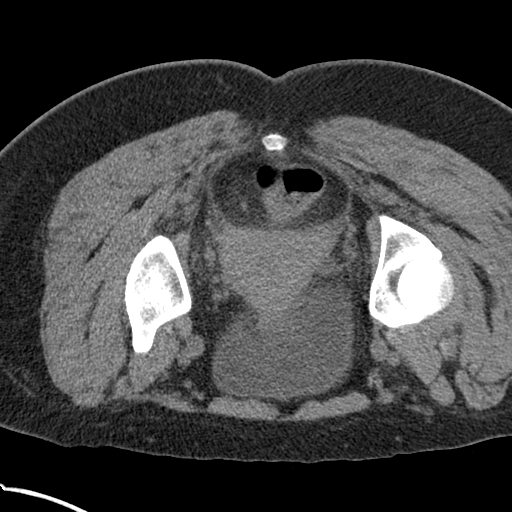

[12 of 32 positions shown; findings below may reference images not displayed]

EXAM:
CT-guided pelvic abscess drain placement

MEDICATIONS:
Moderate sedation

ANESTHESIA/SEDATION:
Moderate (conscious) sedation was employed during this procedure. A
total of Versed 3.0mg and fentanyl 150 mcg was administered
intravenously at the order of the provider performing the procedure.

Total intra-service moderate sedation time: 34 minutes.

Patient's level of consciousness and vital signs were monitored
continuously by radiology nurse throughout the procedure under the
supervision of the provider performing the procedure.

COMPLICATIONS:
None immediate.

PROCEDURE:
Informed written consent was obtained from the patient after a
thorough discussion of the procedural risks, benefits and
alternatives. All questions were addressed.A timeout was performed
prior to the initiation of the procedure.

Patient was placed prone. CT images through the pelvis were
obtained. Pelvic fluid collection was identified. Right buttock was
prepped and draped in sterile fashion. Maximal barrier sterile
technique was utilized including caps, mask, sterile gowns, sterile
gloves, sterile drape, hand hygiene and skin antiseptic. Skin was
anesthetized with 1% lidocaine. A small incision was made. Using CT
guidance, an 18 gauge trocar needle was directed into the pelvic
fluid collection using a right transgluteal approach. Initially,
amber colored fluid was aspirated. Superstiff Amplatz wire was
advanced into the collection and placement was confirmed with CT.
The tract was dilated over the wire to accommodate a 10 French
multipurpose drain. 15 mL of thick bloody fluid was aspirated.
Follow up CT images were obtained. Drain was flushed with saline and
attached to a suction bulb. Catheter was sutured to skin and secured
to skin with a StatLock device. Bandage was placed.
FINDINGS: Patient has a low-density collection in the pelvis just anterior to
the rectum. Needle was successfully placed within the collection
using a right transgluteal approach. 15 mL of thick bloody fluid was
aspirated following placement of the 10 French drain. Fluid was sent
for culture. Drain extends anteriorly and tip is positioned near the
left adnexa.
IMPRESSION: CT-guided placement of a pelvic drain into the tubo-ovarian abscess
using a right transgluteal approach.

## 2023-07-13 IMAGING — CT CT PELVIS W/ CM
2 of 3 series · 11 of 46 positions shown, 12 images · IV contrast (iopamidol)
Comparison: Prior CT scan 03/31/2021

CLINICAL DATA: 30-year-old female with a history of tubo-ovarian
abscess status post percutaneous drain placement on 04/02/2021.

EXAM:
CT PELVIS WITH CONTRAST
TECHNIQUE: Multidetector CT imaging of the pelvis was performed using the
standard protocol following the bolus administration of intravenous
contrast.
CONTRAST:  100mL [id number] IOPAMIDOL ([id number]) INJECTION 61%

[Series 2: pelvis 5.00 br40 s3 · axial · 0.55mm/px · z∈[+1225,+1429]mm · 8 of 47 slices shown, 9 images]
[im 3/47  soft-tissue]
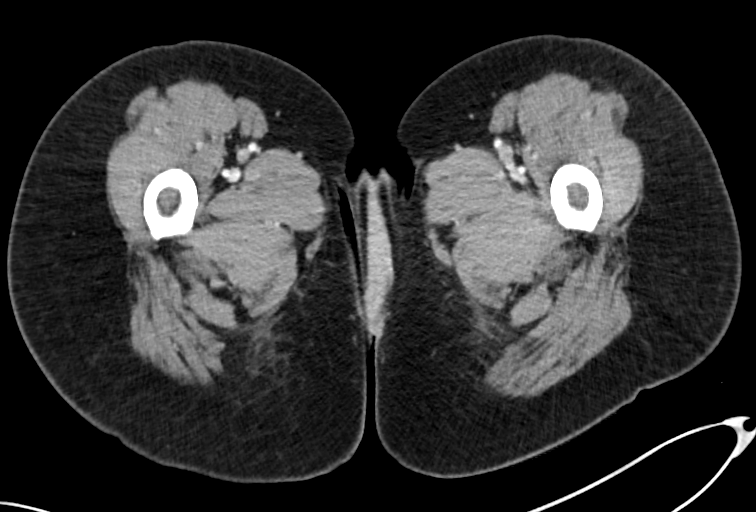
[im 3/47  bone]
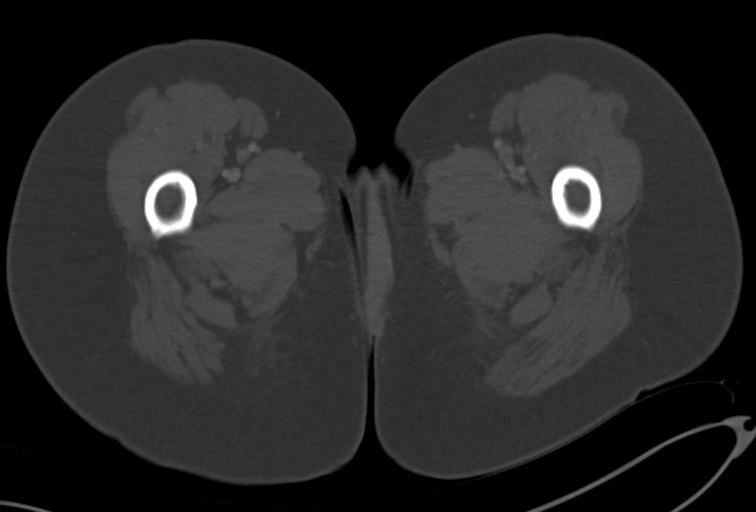
[im 9/47  soft-tissue]
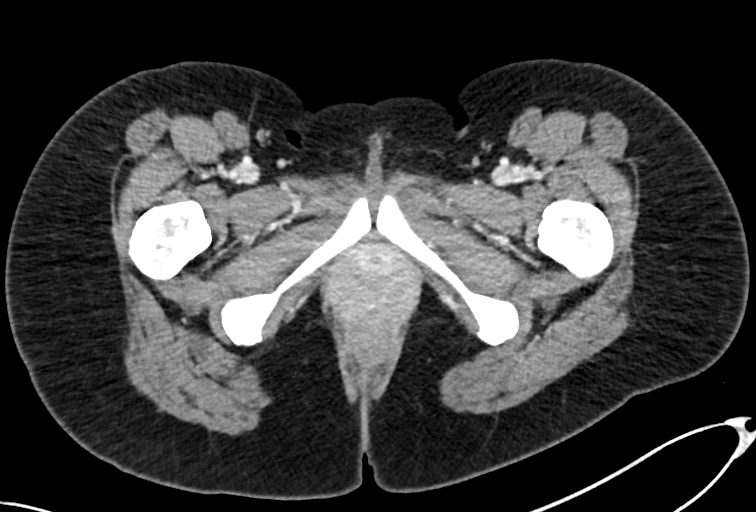
[im 15/47  soft-tissue]
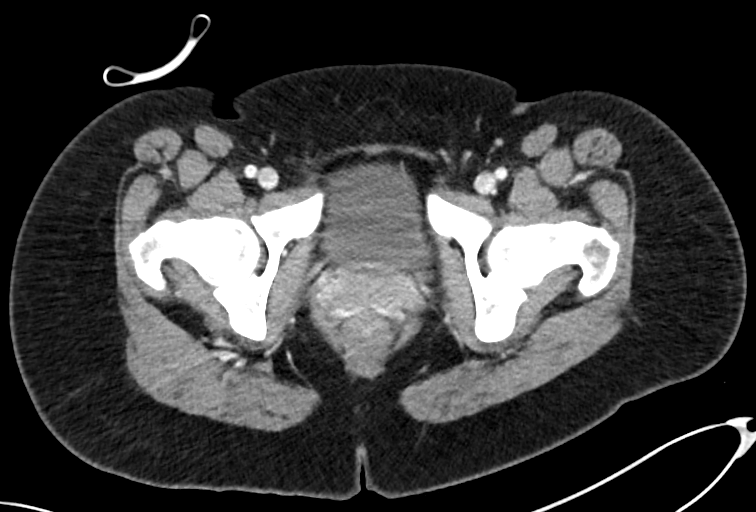
[im 21/47  soft-tissue]
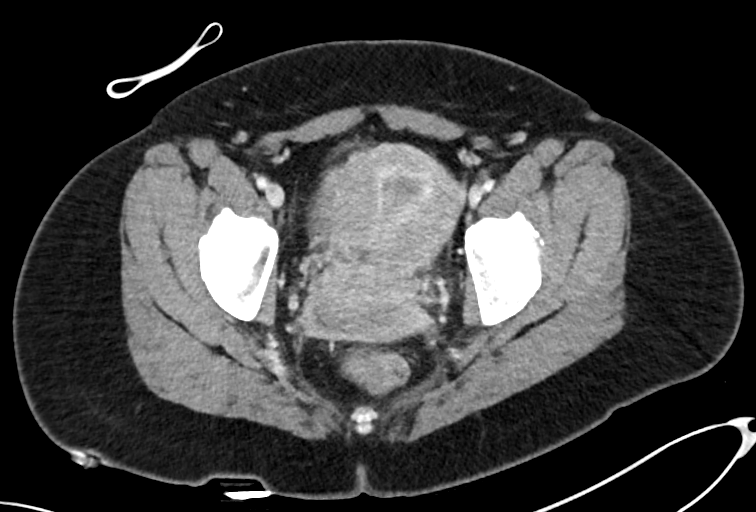
[im 26/47  soft-tissue]
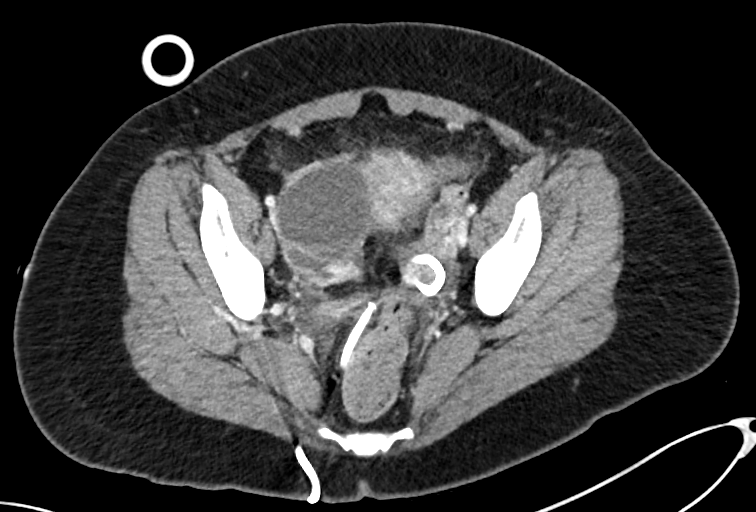
[im 32/47  soft-tissue]
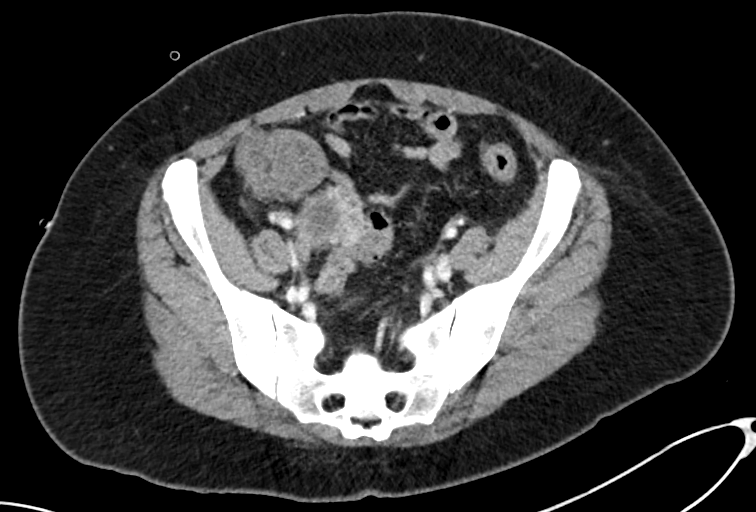
[im 38/47  soft-tissue]
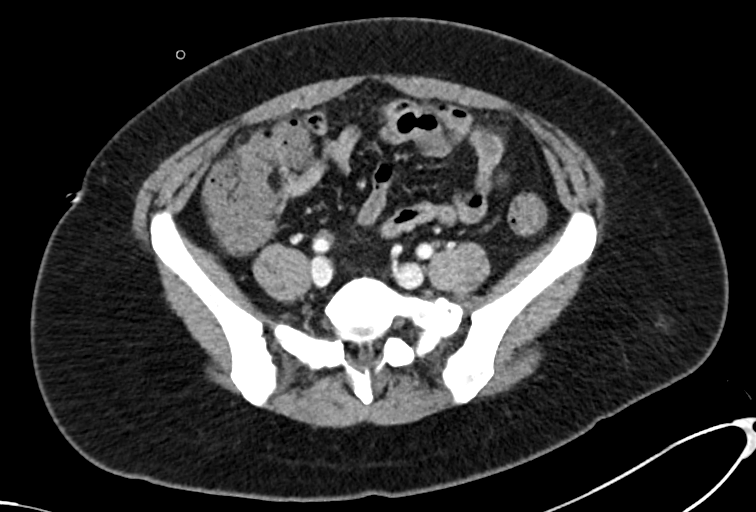
[im 44/47  soft-tissue]
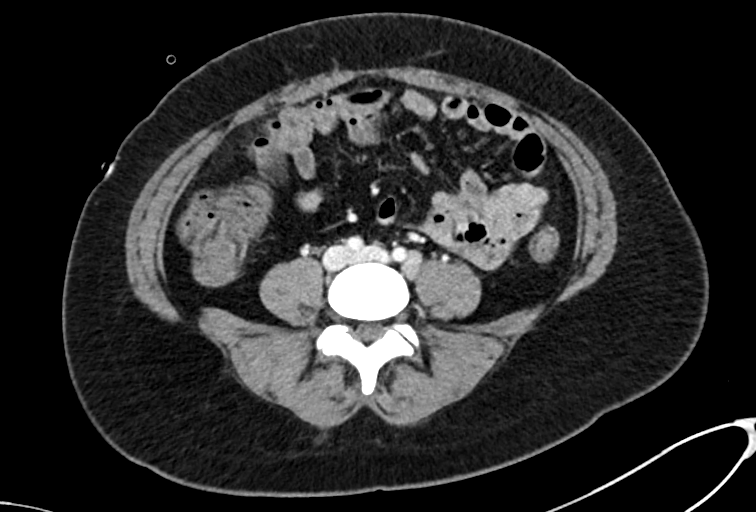

[Series 4: pelvis 2.00 br40 s3 cor · coronal · 0.46mm/px · 3 of 140 slices shown]
[im 47/140  soft-tissue]
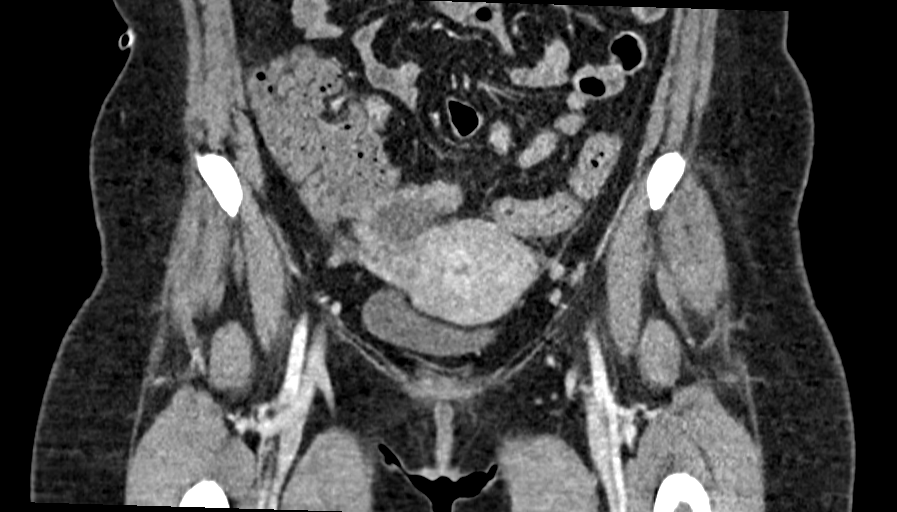
[im 62/140  soft-tissue]
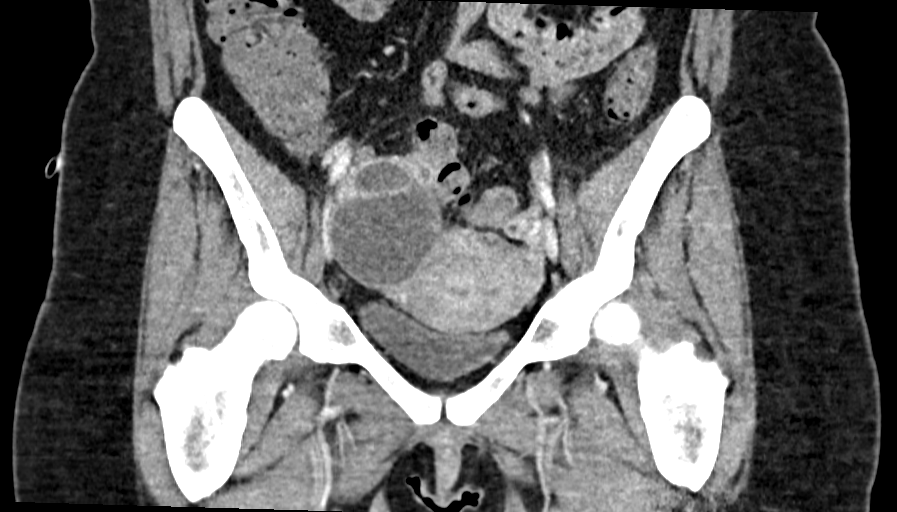
[im 78/140  soft-tissue]
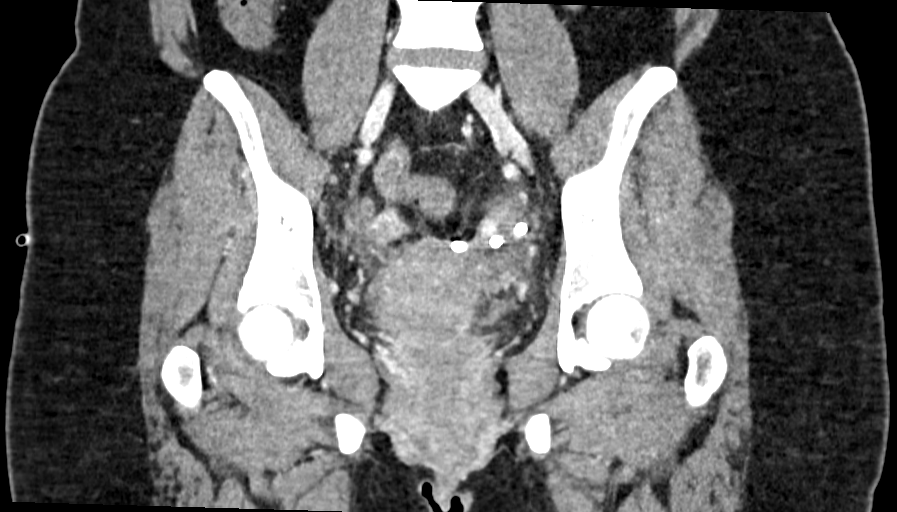

[11 of 46 positions shown; findings below may reference images not displayed]

FINDINGS: Urinary Tract:  No abnormality visualized.

Bowel:  Unremarkable visualized pelvic bowel loops.

Vascular/Lymphatic: No pathologically enlarged lymph nodes. No
significant vascular abnormality seen.

Reproductive: Interval resolution peripherally enhancing fluid
collection affiliated with the left adnexa. The percutaneous
drainage catheter is in good position. Interval enlargement of
low-attenuation cyst affiliated with the right ovary now measuring
5.1 x 4.9 x 4.3 cm (volume = 56 cm^3) compared to 4.2 x 3.3 x
cm (volume = 22 cm^3).

Other:  None.

Musculoskeletal: No suspicious bone lesions identified.
IMPRESSION: 1. Interval resolution of left-sided tubo-ovarian abscess with
drainage catheter in good position.
2. Increasing multiloculated cystic structure affiliated with the
right ovary now measuring up to 56 mL compared to 22 mL previously.
